# Patient Record
Sex: Female | Born: 1955 | Race: White | Hispanic: No | Marital: Married | Smoking: Never smoker
Health system: Southern US, Community
[De-identification: ages and names within clinical notes are randomized; demographics above are authoritative.]

## PROBLEM LIST (undated history)

## (undated) DIAGNOSIS — N809 Endometriosis, unspecified: Secondary | ICD-10-CM

## (undated) DIAGNOSIS — N952 Postmenopausal atrophic vaginitis: Secondary | ICD-10-CM

## (undated) DIAGNOSIS — Z803 Family history of malignant neoplasm of breast: Secondary | ICD-10-CM

## (undated) HISTORY — DX: Postmenopausal atrophic vaginitis: N95.2

## (undated) HISTORY — DX: Endometriosis, unspecified: N80.9

## (undated) HISTORY — PX: FOOT SURGERY: SHX648

## (undated) HISTORY — DX: Family history of malignant neoplasm of breast: Z80.3

---

## 1983-09-17 HISTORY — PX: LAPAROSCOPIC HYSTERECTOMY: SHX1926

## 1993-09-16 HISTORY — PX: OOPHORECTOMY: SHX86

## 2000-02-20 HISTORY — PX: BREAST BIOPSY: SHX20

## 2003-11-20 ENCOUNTER — Emergency Department (HOSPITAL_COMMUNITY): Admission: EM | Admit: 2003-11-20 | Discharge: 2003-11-20 | Payer: Self-pay | Admitting: Emergency Medicine

## 2004-08-14 ENCOUNTER — Ambulatory Visit: Payer: Self-pay

## 2004-09-16 HISTORY — PX: PLACEMENT OF BREAST IMPLANTS: SHX6334

## 2007-02-22 ENCOUNTER — Other Ambulatory Visit: Payer: Self-pay

## 2007-02-22 ENCOUNTER — Emergency Department: Payer: Self-pay | Admitting: Unknown Physician Specialty

## 2007-02-23 ENCOUNTER — Ambulatory Visit: Payer: Self-pay | Admitting: Unknown Physician Specialty

## 2008-09-16 HISTORY — PX: COLONOSCOPY: SHX174

## 2008-12-06 ENCOUNTER — Ambulatory Visit: Payer: Self-pay | Admitting: Unknown Physician Specialty

## 2011-09-17 HISTORY — PX: COLONOSCOPY: SHX174

## 2013-11-09 ENCOUNTER — Ambulatory Visit: Payer: Self-pay | Admitting: Podiatry

## 2013-11-11 ENCOUNTER — Encounter: Payer: Self-pay | Admitting: Podiatry

## 2013-11-12 ENCOUNTER — Ambulatory Visit (INDEPENDENT_AMBULATORY_CARE_PROVIDER_SITE_OTHER): Payer: BC Managed Care – PPO

## 2013-11-12 ENCOUNTER — Ambulatory Visit (INDEPENDENT_AMBULATORY_CARE_PROVIDER_SITE_OTHER): Payer: BC Managed Care – PPO | Admitting: Podiatry

## 2013-11-12 ENCOUNTER — Encounter: Payer: Self-pay | Admitting: Podiatry

## 2013-11-12 VITALS — BP 118/64 | HR 74 | Resp 16 | Ht 64.0 in | Wt 140.0 lb

## 2013-11-12 DIAGNOSIS — L600 Ingrowing nail: Secondary | ICD-10-CM

## 2013-11-12 DIAGNOSIS — M2040 Other hammer toe(s) (acquired), unspecified foot: Secondary | ICD-10-CM

## 2013-11-12 DIAGNOSIS — M79673 Pain in unspecified foot: Secondary | ICD-10-CM

## 2013-11-12 DIAGNOSIS — M79609 Pain in unspecified limb: Secondary | ICD-10-CM

## 2013-11-12 NOTE — Patient Instructions (Addendum)

## 2013-11-14 NOTE — Progress Notes (Signed)
Subjective:     Patient ID: Rachel Dorsey, female   DOB: [birth year], 58 y.o.   MRN: [id number]  HPI patient presents stating that the left hallux nail has become ingrown and sore and the third toe right is mildly lifted after arthroplasty and is now her for that the nail is somewhat thickened and reactive   Review of Systems     Objective:   Physical Exam Neurovascular status intact with no health history changes noted a patient well oriented x3. Medial border left hallux is incurvated with slight yellow discoloration of the hallux nail and also third toe right is mildly elevated nonweightbearing but is not elevated weightbearing and does have some discoloration and thickness of the nail bed    Assessment:     Ingrown toenail left hallux and mild mycotic nail right third nail secondary to probable increased pressure    Plan:     Reviewed x-rays of right foot and discussed consideration for tenotomy which patient will think about. Recommended removal of nail corner left and explained procedure and risk. Patient wants procedure and today I infiltrated 60 mg Xylocaine Marcaine mixture remove the medial border exposed matrix and applied 3 applications of phenol 30 seconds followed by alcohol lavaged and sterile dressing. Gave instructions on soaks and reappoint

## 2013-11-25 ENCOUNTER — Ambulatory Visit: Payer: Self-pay | Admitting: Internal Medicine

## 2014-10-25 DIAGNOSIS — K7689 Other specified diseases of liver: Secondary | ICD-10-CM | POA: Insufficient documentation

## 2014-11-23 ENCOUNTER — Ambulatory Visit: Payer: Self-pay | Admitting: Internal Medicine

## 2014-12-10 ENCOUNTER — Ambulatory Visit: Payer: Self-pay | Admitting: Internal Medicine

## 2014-12-10 LAB — CREATININE, SERUM
CREATININE: 0.77 mg/dL
EGFR (Non-African Amer.): >60

## 2015-02-16 ENCOUNTER — Encounter: Payer: Self-pay | Admitting: *Deleted

## 2015-02-28 ENCOUNTER — Ambulatory Visit (INDEPENDENT_AMBULATORY_CARE_PROVIDER_SITE_OTHER): Payer: Managed Care, Other (non HMO) | Admitting: General Surgery

## 2015-02-28 ENCOUNTER — Other Ambulatory Visit: Payer: Managed Care, Other (non HMO)

## 2015-02-28 ENCOUNTER — Encounter: Payer: Self-pay | Admitting: General Surgery

## 2015-02-28 VITALS — BP 128/82 | HR 82 | Resp 12 | Ht 64.0 in | Wt 146.0 lb

## 2015-02-28 DIAGNOSIS — R928 Other abnormal and inconclusive findings on diagnostic imaging of breast: Secondary | ICD-10-CM

## 2015-02-28 DIAGNOSIS — N6310 Unspecified lump in the right breast, unspecified quadrant: Secondary | ICD-10-CM

## 2015-02-28 NOTE — Progress Notes (Signed)
Patient ID: Rachel Dorsey, female   DOB: [birth year], 59 y.o.   MRN: [id number]  Chief Complaint  Patient presents with  . Other    eval mammogram    HPI Rachel SCOGIN is a 59 y.o. female.  who presents for a breast evaluation. The most recent mammogram was done on 02-02-15. She also had a right breast mammogram 02-08-15 and ultrasound on 02-09-15.  Patient does perform regular self breast checks and gets regular mammograms done.  She states the right breast has been a little tender on the lower part of the breast for 3-4 months. She states she can not feel a lump. Denies any breast injury or trauma. With her recently completed radiologic studies, the patient elected to discontinue her as to general placement therapy. To date, she has not experienced much in the way vasomotor symptoms. She stopped taking her hormone tablet 3 weeks ago. She had breast implants she went from a B to D.  The patient was last seen here in 2001 for a mass in the upper-outer quadrant of the left breast. Multiple cysts were identified at that time.   HPI  History reviewed. No pertinent past medical history.  Past Surgical History  Procedure Laterality Date  . Foot surgery Right   . Laparoscopic hysterectomy  1985    age 35  . Placement of breast implants  [address] [place]. Mentor saline implants.   . Oophorectomy  1995  . Breast biopsy Right 02-20-00    cyst  . Colonoscopy  2013    Dr Vira Agar    Family History  Problem Relation Age of Onset  . Aortic aneurysm Father 15  . Breast cancer Maternal Grandmother 67    Social History History  Substance Use Topics  . Smoking status: Never Smoker   . Smokeless tobacco: Never Used  . Alcohol Use: 0.0 oz/week    0 Standard drinks or equivalent per week     Comment: occasionally wine 1/week    Allergies  Allergen Reactions  . Erythromycin Diarrhea    Colitis    Current Outpatient Prescriptions  Medication Sig Dispense Refill  .  Cholecalciferol (VITAMIN D-3) 1000 UNITS CAPS Take by mouth.    . vitamin B-12 (CYANOCOBALAMIN) 100 MCG tablet Take 100 mcg by mouth daily.     No current facility-administered medications for this visit.    Review of Systems Review of Systems  Constitutional: Negative.   Respiratory: Negative.   Cardiovascular: Negative.     Blood pressure 128/82, pulse 82, resp. rate 12, height 5\' 4"  (1.626 m), weight 146 lb (66.225 kg).  Physical Exam Physical Exam  Constitutional: She is oriented to person, place, and time. She appears well-developed and well-nourished.  Neck: Neck supple.  Cardiovascular: Normal rate, regular rhythm and normal heart sounds.   Pulmonary/Chest: Effort normal and breath sounds normal. Right breast exhibits no inverted nipple, no mass, no nipple discharge, no skin change and no tenderness. Left breast exhibits no inverted nipple, no mass, no nipple discharge, no skin change and no tenderness.  Lymphadenopathy:    She has no cervical adenopathy.    She has no axillary adenopathy.  Neurological: She is alert and oriented to person, place, and time.  Skin: Skin is warm and dry.    Data Reviewed PCP notes of 01/10/2015 were reviewed. Note patient made to prior attempts to discontinue estradiol therapy were hampered by severe vasomotor symptoms. Normal breast exam recorded.  Bilateral screening mammograms  dated 02/08/2015 completed at [facility] in [place] were reviewed as well as an associated ultrasound. This was a 3-D exam. An oval mass was identified in the lower outer aspect of the right breast. BI-RADS-0. Ultrasound examination showed a 7 mm oval complex cyst which a 6 month follow-up was recommended. BI-RADS-3.  Breast implant data sheet reviewed.    Assessment    Mild mastalgia, likely unrelated to recently identified complex cyst involving the right breast.  Significant anxiety with long-standing estrogen use and recent mammographic findings.     Plan    The opportunity for FNA sampling of the area was reviewed and acceptable patient. The small but real risk of implant injury was discussed.  Ultrasound examination of the right breast in the 6:00 position, 7 cm from the nipple showed a 0.3 x 0.4 x 0.46 cm complex lesion corresponding to the slightly larger lesion noted last month on her original imaging studies and 3 weeks status post cessation of hormone therapy. Using 1 mL of 1% plain Xylocaine the area was aspirated with complete resolution. The pectoralis muscle was well visualized during the procedure. Of note, multiple other small cystic areas were noted throughout the retroareolar area of the breast. Slides 4 were prepared for cytology.  The recommendation for a 6 month follow-up ultrasound is appropriate.    Will notify her of the FNA results. Follow up in November with office ultrasound.  PCP:  Emily Filbert F Ref: Fraser Din  Robert Bellow 03/01/2015, 6:40 PM

## 2015-02-28 NOTE — Patient Instructions (Addendum)
The patient is aware to call back for any questions or concerns. Continue self breast exams. Call office for any new breast issues or concerns. 

## 2015-03-01 ENCOUNTER — Encounter: Payer: Self-pay | Admitting: General Surgery

## 2015-03-01 ENCOUNTER — Telehealth: Payer: Self-pay | Admitting: General Surgery

## 2015-03-01 DIAGNOSIS — R928 Other abnormal and inconclusive findings on diagnostic imaging of breast: Secondary | ICD-10-CM | POA: Insufficient documentation

## 2015-03-01 NOTE — Telephone Encounter (Signed)
Notified cytology results were benign, consistent with fibrocystic changes. (Likely perpetuated by long term estradiol therapy. Follow up as previously scheduled.

## 2015-07-25 ENCOUNTER — Encounter: Payer: Self-pay | Admitting: General Surgery

## 2015-07-31 ENCOUNTER — Encounter: Payer: Self-pay | Admitting: General Surgery

## 2015-07-31 ENCOUNTER — Ambulatory Visit: Payer: Managed Care, Other (non HMO)

## 2015-07-31 ENCOUNTER — Ambulatory Visit (INDEPENDENT_AMBULATORY_CARE_PROVIDER_SITE_OTHER): Payer: Managed Care, Other (non HMO) | Admitting: General Surgery

## 2015-07-31 VITALS — BP 122/78 | HR 72 | Resp 12 | Ht 64.0 in | Wt 152.0 lb

## 2015-07-31 DIAGNOSIS — N6009 Solitary cyst of unspecified breast: Secondary | ICD-10-CM | POA: Insufficient documentation

## 2015-07-31 DIAGNOSIS — N6001 Solitary cyst of right breast: Secondary | ICD-10-CM

## 2015-07-31 DIAGNOSIS — N644 Mastodynia: Secondary | ICD-10-CM

## 2015-07-31 DIAGNOSIS — N6310 Unspecified lump in the right breast, unspecified quadrant: Secondary | ICD-10-CM

## 2015-07-31 NOTE — Progress Notes (Signed)
Patient ID: Rachel Dorsey, female   DOB: 1956/08/04, 59 y.o.   MRN: [id number]  Chief Complaint  Patient presents with  . Follow-up    right breast ultrasound    HPI Rachel Dorsey is a 59 y.o. female here today for her five month follow up for a right breast ultrasound. She states the tenderness is still there, comes and goes.  HPI  No past medical history on file.  Past Surgical History  Procedure Laterality Date  . Foot surgery Right   . Laparoscopic hysterectomy  1985    age 12  . Placement of breast implants  [address], [place]. Mentor saline implants.   . Oophorectomy  1995  . Breast biopsy Right 02-20-00    cyst  . Colonoscopy  2013    Dr Vira Agar    Family History  Problem Relation Age of Onset  . Aortic aneurysm Father 62  . Breast cancer Maternal Grandmother 60    Social History Social History  Substance Use Topics  . Smoking status: Never Smoker   . Smokeless tobacco: Never Used  . Alcohol Use: 0.0 oz/week    0 Standard drinks or equivalent per week     Comment: occasionally wine 1/week    Allergies  Allergen Reactions  . Erythromycin Diarrhea    Colitis    Current Outpatient Prescriptions  Medication Sig Dispense Refill  . Cholecalciferol (VITAMIN D-3) 1000 UNITS CAPS Take by mouth.    . vitamin B-12 (CYANOCOBALAMIN) 100 MCG tablet Take 100 mcg by mouth daily.     No current facility-administered medications for this visit.    Review of Systems Review of Systems  Constitutional: Negative.   Respiratory: Negative.   Cardiovascular: Negative.     Blood pressure 122/78, pulse 72, resp. rate 12, height 5\' 4"  (1.626 m), weight 152 lb (68.947 kg).  Physical Exam Physical Exam  Constitutional: She is oriented to person, place, and time. She appears well-developed and well-nourished.  Eyes: Conjunctivae are normal. No scleral icterus.  Neck: Neck supple.  Cardiovascular: Normal rate, regular rhythm and normal heart sounds.    Pulmonary/Chest: Effort normal and breath sounds normal. Right breast exhibits no inverted nipple, no mass, no nipple discharge, no skin change and no tenderness. Left breast exhibits no inverted nipple, no mass, no nipple discharge, no skin change and no tenderness.    Lymphadenopathy:    She has no cervical adenopathy.  Neurological: She is alert and oriented to person, place, and time.  Skin: Skin is warm and dry.    Data Reviewed Cytology from June 2016 evaluation was benign. NA RIGHT BREAST: ABUNDANT BANAL APPEARING APOCRINE CELLS. SEE COMMENT. COMMENT: THE FINDINGS MAY REPRESENT FIBROCYSTIC CHANGES. NONETHELESS, CYTOLOGIC MALIGNANT FEATURES ARE NOT IDENTIFIED.  2001 right breast cyst aspiration showed foam cells.  Ultrasound examination of the right breast was repeated to reassess the area of previous mammographic/ultrasound abnormality. There is a small 0.16 x 0.21 x 0.2 to recurrent simple cyst in this area approximately 4 mm above the underlying implant. No internal flow. Faint acoustic enhancement. This is located 7 cm from the nipple.  Examination of the retroareolar area again shows prominent ductal tissue measuring up to 0.21 cm in diameter. No intraductal lesions identified. BI-RADS-2.   Assessment    Benign breast exam.    Plan    Observation alone is warranted. The patient had been instructed to have a follow-up examination in December at the mammography center. This is not  necessary based on her previous previously completed FNA and repeat ultrasound today.  She should continue annual screening mammograms in summer 2017 with her PCP.  She was asked to call should she appreciate any change in her breast discomfort, more persistent or localized discomfort or any palpable abnormality.    Patient to return as needed.  PCP:  Fanny Skates 07/31/2015, 12:28 PM

## 2015-07-31 NOTE — Patient Instructions (Signed)
Patient to return as needed. Continue self breast exams. Call office for any new breast issues or concerns.'  

## 2016-05-31 ENCOUNTER — Encounter: Payer: Self-pay | Admitting: *Deleted

## 2016-05-31 ENCOUNTER — Encounter: Payer: Self-pay | Admitting: Podiatry

## 2016-05-31 ENCOUNTER — Ambulatory Visit (INDEPENDENT_AMBULATORY_CARE_PROVIDER_SITE_OTHER): Payer: 59

## 2016-05-31 ENCOUNTER — Ambulatory Visit (INDEPENDENT_AMBULATORY_CARE_PROVIDER_SITE_OTHER): Payer: 59 | Admitting: Podiatry

## 2016-05-31 VITALS — BP 106/68 | HR 75 | Resp 16

## 2016-05-31 DIAGNOSIS — Z780 Asymptomatic menopausal state: Secondary | ICD-10-CM | POA: Insufficient documentation

## 2016-05-31 DIAGNOSIS — M79673 Pain in unspecified foot: Secondary | ICD-10-CM | POA: Diagnosis not present

## 2016-05-31 DIAGNOSIS — M7750 Other enthesopathy of unspecified foot: Secondary | ICD-10-CM

## 2016-05-31 DIAGNOSIS — M7752 Other enthesopathy of left foot: Secondary | ICD-10-CM | POA: Diagnosis not present

## 2016-05-31 DIAGNOSIS — M778 Other enthesopathies, not elsewhere classified: Secondary | ICD-10-CM

## 2016-05-31 DIAGNOSIS — M779 Enthesopathy, unspecified: Secondary | ICD-10-CM

## 2016-05-31 DIAGNOSIS — I341 Nonrheumatic mitral (valve) prolapse: Secondary | ICD-10-CM | POA: Insufficient documentation

## 2016-05-31 MED ORDER — METHYLPREDNISOLONE 4 MG PO TBPK: ORAL_TABLET | ORAL | 0 refills | Status: DC

## 2016-05-31 NOTE — Progress Notes (Signed)
   Subjective:    Patient ID: Rachel Dorsey, female    DOB: [birth year], 61 y.o.   MRN: [id number]  HPI    Review of Systems  All other systems reviewed and are negative.      Objective:   Physical Exam        Assessment & Plan:

## 2016-06-04 MED ORDER — BETAMETHASONE SOD PHOS & ACET 6 (3-3) MG/ML IJ SUSP: 12.0000 mg | Freq: Once | INTRAMUSCULAR | Status: AC

## 2016-06-04 NOTE — Progress Notes (Signed)
Patient ID: Rachel Dorsey, female   DOB: [birth year], 60 y.o.   MRN: [id number] Subjective: Patient presents today for bilateral forefoot pain. Patient is more symptomatic on the left foot. Patient states that the pain has been ongoing for some time now. Patient presents for further treatment and evaluation   Objective: Physical Exam General: The patient is alert and oriented x3 in no acute distress.  Dermatology: Skin is warm, dry and supple bilateral lower extremities. Negative for open lesions or macerations.  Vascular: Palpable pedal pulses bilaterally. No edema or erythema noted. Capillary refill within normal limits.  Neurological: Epicritic and protective threshold grossly intact bilaterally.   Musculoskeletal Exam:  Severe pain on palpation to the first and second MPJs of the left foot. Also pain on palpation to the first and second MPJs of the right foot. Range of motion within normal limits to all pedal and ankle joints bilateral. Muscle strength 5/5 in all groups bilateral.   Radiographic Exam:  Normal osseous mineralization. Joint spaces preserved. No fracture/dislocation/boney destruction.     Assessment: #1 pain in bilateral feet #2 capsulitis first and second MPJ left foot #3 capsulitis first and second MPJ right foot  Problem List Items Addressed This Visit    None    Visit Diagnoses    Foot pain, unspecified laterality    -  Primary   Relevant Orders   DG Foot Complete Left   DG Foot Complete Right        Plan of Care:  #1 Patient was evaluated.X-rays were taken and reviewed with the patient #2 Injection of 0.5 mL Celestone Soluspan injected in the first MPJ capsule of the left foot. #3 injection of 0.5 mL Celestone Soluspan injected into the second MPJ capsule the left foot. #4 Medrol Dosepak prescribed for the patient  #5 metatarsal pads dispensed to the patient #6 patient is to return to clinic in 4 weeks     Dr. Edrick Kins, [place]

## 2016-07-09 ENCOUNTER — Ambulatory Visit: Payer: 59 | Admitting: Podiatry

## 2016-09-17 ENCOUNTER — Encounter: Payer: Self-pay | Admitting: *Deleted

## 2016-09-23 ENCOUNTER — Ambulatory Visit: Payer: 59 | Admitting: General Surgery

## 2016-10-01 ENCOUNTER — Ambulatory Visit (INDEPENDENT_AMBULATORY_CARE_PROVIDER_SITE_OTHER): Payer: 59 | Admitting: General Surgery

## 2016-10-01 ENCOUNTER — Encounter: Payer: Self-pay | Admitting: General Surgery

## 2016-10-01 VITALS — BP 122/72 | HR 80 | Resp 12 | Ht 64.0 in | Wt 155.0 lb

## 2016-10-01 DIAGNOSIS — N644 Mastodynia: Secondary | ICD-10-CM | POA: Diagnosis not present

## 2016-10-01 NOTE — Patient Instructions (Addendum)
The patient is aware to call back for any questions or concerns. Return as needed.  

## 2016-10-01 NOTE — Progress Notes (Signed)
Patient ID: Rachel Dorsey, female   DOB: [birth year], 61 y.o.   MRN: [id number]  Chief Complaint  Patient presents with  . Breast Problem    right breast cyst    HPI Rachel Dorsey is a 61 y.o. female.  Here today for evaluation of a possible right breast cyst. Patient states the whole month of December the areas wastender, now in the last week the area has improved.   HPI  No past medical history on file.  Past Surgical History:  Procedure Laterality Date  . BREAST BIOPSY Right 02-20-00   cyst  . COLONOSCOPY  2013   Dr Vira Agar  . FOOT SURGERY Right   . LAPAROSCOPIC HYSTERECTOMY  1985   age 53  . OOPHORECTOMY  1995  . PLACEMENT OF BREAST IMPLANTS  2006   [place], [place]. Mentor saline implants.     Family History  Problem Relation Age of Onset  . Aortic aneurysm Father 45  . Breast cancer Maternal Grandmother 60    Social History Social History  Substance Use Topics  . Smoking status: Never Smoker  . Smokeless tobacco: Never Used  . Alcohol use 0.0 oz/week     Comment: occasionally wine 1/week    Allergies  Allergen Reactions  . Erythromycin Diarrhea    Colitis    Current Outpatient Prescriptions  Medication Sig Dispense Refill  . Cholecalciferol (VITAMIN D-3) 1000 UNITS CAPS Take by mouth.    . vitamin B-12 (CYANOCOBALAMIN) 100 MCG tablet Take 100 mcg by mouth daily.     Current Facility-Administered Medications  Medication Dose Route Frequency Provider Last Rate Last Dose  . betamethasone acetate-betamethasone sodium phosphate (CELESTONE) injection 12 mg  12 mg Intramuscular Once Edrick Kins, DPM        Review of Systems Review of Systems  Constitutional: Negative.   Respiratory: Negative.   Cardiovascular: Negative.     Blood pressure 122/72, pulse 80, resp. rate 12, height 5\' 4"  (1.626 m), weight 155 lb (70.3 kg).  Physical Exam Physical Exam  Constitutional: She is oriented to person, place, and time. She appears well-developed and  well-nourished.  Eyes: Conjunctivae are normal. No scleral icterus.  Neck: Neck supple.  Cardiovascular: Normal rate, regular rhythm and normal heart sounds.   Pulmonary/Chest: Effort normal and breath sounds normal. Right breast exhibits no inverted nipple, no mass, no nipple discharge, no skin change and no tenderness. Left breast exhibits no inverted nipple, no mass, no nipple discharge, no skin change and no tenderness.  Lymphadenopathy:    She has no cervical adenopathy.    She has no axillary adenopathy.  Neurological: She is alert and oriented to person, place, and time.  Skin: Skin is warm and dry.  Psychiatric: Her behavior is normal.    Data Reviewed May 2017 mammograms completed at [facility] not available for review.  Assessment    Benign breast exam.    Plan    Annual screening mammograms and clinical self-exam recommended.    Return as needed.  This information has been scribed by Gaspar Cola CMA. Robert Bellow 10/02/2016, 1:53 PM

## 2016-11-18 DIAGNOSIS — Z719 Counseling, unspecified: Secondary | ICD-10-CM | POA: Diagnosis not present

## 2016-11-21 DIAGNOSIS — Z0000 Encounter for general adult medical examination without abnormal findings: Secondary | ICD-10-CM | POA: Diagnosis not present

## 2016-11-27 DIAGNOSIS — Z719 Counseling, unspecified: Secondary | ICD-10-CM | POA: Diagnosis not present

## 2016-11-28 DIAGNOSIS — Z0000 Encounter for general adult medical examination without abnormal findings: Secondary | ICD-10-CM | POA: Diagnosis not present

## 2016-12-02 DIAGNOSIS — Z719 Counseling, unspecified: Secondary | ICD-10-CM | POA: Diagnosis not present

## 2016-12-16 DIAGNOSIS — Z719 Counseling, unspecified: Secondary | ICD-10-CM | POA: Diagnosis not present

## 2016-12-26 ENCOUNTER — Other Ambulatory Visit: Payer: Self-pay | Admitting: Internal Medicine

## 2016-12-26 DIAGNOSIS — K5909 Other constipation: Secondary | ICD-10-CM | POA: Diagnosis not present

## 2016-12-26 DIAGNOSIS — R1011 Right upper quadrant pain: Secondary | ICD-10-CM | POA: Diagnosis not present

## 2016-12-30 DIAGNOSIS — Z719 Counseling, unspecified: Secondary | ICD-10-CM | POA: Diagnosis not present

## 2017-01-03 ENCOUNTER — Other Ambulatory Visit: Payer: Self-pay

## 2017-01-03 ENCOUNTER — Encounter
Admission: RE | Admit: 2017-01-03 | Discharge: 2017-01-03 | Disposition: A | Discharge status: Home / Self Care | Payer: 59 | Source: Ambulatory Visit | Attending: Internal Medicine | Admitting: Internal Medicine

## 2017-01-03 DIAGNOSIS — K7689 Other specified diseases of liver: Secondary | ICD-10-CM | POA: Diagnosis not present

## 2017-01-03 DIAGNOSIS — R1011 Right upper quadrant pain: Secondary | ICD-10-CM | POA: Diagnosis present

## 2017-01-06 DIAGNOSIS — Z719 Counseling, unspecified: Secondary | ICD-10-CM | POA: Diagnosis not present

## 2017-01-13 DIAGNOSIS — Z719 Counseling, unspecified: Secondary | ICD-10-CM | POA: Diagnosis not present

## 2017-01-15 ENCOUNTER — Encounter
Admission: RE | Admit: 2017-01-15 | Discharge: 2017-01-15 | Disposition: A | Discharge status: Home / Self Care | Payer: 59 | Source: Ambulatory Visit | Attending: Internal Medicine | Admitting: Internal Medicine

## 2017-01-15 DIAGNOSIS — R1011 Right upper quadrant pain: Secondary | ICD-10-CM | POA: Diagnosis not present

## 2017-01-15 MED ORDER — TECHNETIUM TC 99M MEBROFENIN IV KIT
5.0000 | PACK | Freq: Once | INTRAVENOUS | Status: AC | PRN
  Administered 2017-01-15: 5.375 via INTRAVENOUS

## 2017-02-06 ENCOUNTER — Encounter: Payer: Self-pay | Admitting: Family Medicine

## 2017-02-06 ENCOUNTER — Encounter: Payer: Self-pay | Admitting: Obstetrics and Gynecology

## 2017-02-06 DIAGNOSIS — Z1231 Encounter for screening mammogram for malignant neoplasm of breast: Secondary | ICD-10-CM | POA: Diagnosis not present

## 2017-02-13 ENCOUNTER — Encounter: Payer: Self-pay | Admitting: Obstetrics and Gynecology

## 2017-02-17 ENCOUNTER — Telehealth: Payer: Self-pay | Admitting: Obstetrics and Gynecology

## 2017-02-17 NOTE — Telephone Encounter (Signed)
Pt aware of normal bone density on DEXA scan at [facility] 5/18.

## 2017-02-25 DIAGNOSIS — R1013 Epigastric pain: Secondary | ICD-10-CM | POA: Diagnosis not present

## 2017-03-04 ENCOUNTER — Other Ambulatory Visit: Payer: Self-pay | Admitting: Internal Medicine

## 2017-03-04 DIAGNOSIS — R1084 Generalized abdominal pain: Secondary | ICD-10-CM | POA: Diagnosis not present

## 2017-03-06 ENCOUNTER — Ambulatory Visit
Admission: RE | Admit: 2017-03-06 | Discharge: 2017-03-06 | Disposition: A | Discharge status: Home / Self Care | Payer: Commercial Managed Care - HMO | Source: Ambulatory Visit | Attending: Internal Medicine | Admitting: Internal Medicine

## 2017-03-06 DIAGNOSIS — R1084 Generalized abdominal pain: Secondary | ICD-10-CM | POA: Diagnosis present

## 2017-03-06 DIAGNOSIS — R109 Unspecified abdominal pain: Secondary | ICD-10-CM | POA: Diagnosis not present

## 2017-03-06 DIAGNOSIS — K7689 Other specified diseases of liver: Secondary | ICD-10-CM | POA: Insufficient documentation

## 2017-03-06 DIAGNOSIS — Z90710 Acquired absence of both cervix and uterus: Secondary | ICD-10-CM | POA: Insufficient documentation

## 2017-03-06 LAB — POCT I-STAT CREATININE: CREATININE: 0.8 mg/dL (ref 0.44–1.00)

## 2017-03-06 MED ORDER — IOPAMIDOL (ISOVUE-300) INJECTION 61%
100.0000 mL | Freq: Once | INTRAVENOUS | Status: AC | PRN
  Administered 2017-03-06: 100 mL via INTRAVENOUS

## 2017-03-13 DIAGNOSIS — R1084 Generalized abdominal pain: Secondary | ICD-10-CM | POA: Diagnosis not present

## 2017-05-15 DIAGNOSIS — L814 Other melanin hyperpigmentation: Secondary | ICD-10-CM | POA: Diagnosis not present

## 2017-06-19 DIAGNOSIS — Z23 Encounter for immunization: Secondary | ICD-10-CM | POA: Diagnosis not present

## 2017-06-23 ENCOUNTER — Ambulatory Visit (INDEPENDENT_AMBULATORY_CARE_PROVIDER_SITE_OTHER): Payer: 59 | Admitting: Podiatry

## 2017-06-23 ENCOUNTER — Ambulatory Visit: Payer: 59

## 2017-06-23 ENCOUNTER — Ambulatory Visit (INDEPENDENT_AMBULATORY_CARE_PROVIDER_SITE_OTHER): Payer: 59

## 2017-06-23 DIAGNOSIS — M722 Plantar fascial fibromatosis: Secondary | ICD-10-CM | POA: Diagnosis not present

## 2017-06-23 DIAGNOSIS — S92911A Unspecified fracture of right toe(s), initial encounter for closed fracture: Secondary | ICD-10-CM

## 2017-06-23 DIAGNOSIS — S90121A Contusion of right lesser toe(s) without damage to nail, initial encounter: Secondary | ICD-10-CM

## 2017-06-23 MED ORDER — MELOXICAM 15 MG PO TABS: 15.0000 mg | ORAL_TABLET | Freq: Every day | ORAL | 3 refills | Status: DC

## 2017-06-23 NOTE — Progress Notes (Signed)
This patient presents to the office stating she injured her third toe right foot and may have broken her toe.  She says she had previous surgery in this toe and it sits up.  She says Saturday her pain was severe but the pain has lessened through the weekend.  She says she has developed purplish discoloration through the weekend.  She also believes she has plantar fascia left heel  She says she has pain upon rising in the morning and standing from a sitting position.  She says her pain level is 4.  She says ibuprofen has helped reduce her pain.  This pain has been present for 2-3 months and has not resolved.  She presents for evaluation and treatment  General Appearance  Alert, conversant and in no acute stress.  Vascular  Dorsalis pedis and posterior pulses are palpable  bilaterally.  Capillary return is within normal limits  Bilaterally. Temperature is within normal limits  Bilaterally  Neurologic  Senn-Weinstein monofilament wire test within normal limits  bilaterally. Muscle power  Within normal limits bilaterally.  Nails Normal nails with no evidence of infection or fungus.  Orthopedic  No limitations of motion of motion feet bilaterally.  No crepitus or effusions noted.  No bony pathology or digital deformities noted. Third toe is in fixed dorsiflexed position with ecchymosis noted at third  MPJ.  Palpable pain at the insertion plantar fascia left foot.    Skin  normotropic skin with no porokeratosis noted bilaterally.  No signs of infections or ulcers noted.  Incisions on 3,4 right toes.    Contusion 3rd toe right foot  Plantar fascitis left heel.  ROV  Xrays reveal no bony pathology but absence of bone noted 3,4 toes right foot.  Minimal calcification at the insertion plantar fascia left foot.  Buddy taping of 3,4 toes right was performed.   Prescribe  Mobic  To be taken by mouth.  RTC 3 weeks.   Gardiner Barefoot DPM

## 2017-06-23 NOTE — Addendum Note (Signed)
Addended by: Graceann Congress D on: 06/23/2017 02:10 PM   Modules accepted: Orders

## 2017-07-02 DIAGNOSIS — M722 Plantar fascial fibromatosis: Secondary | ICD-10-CM | POA: Diagnosis not present

## 2017-07-03 ENCOUNTER — Ambulatory Visit: Payer: 59 | Admitting: Podiatry

## 2017-07-07 DIAGNOSIS — H11441 Conjunctival cysts, right eye: Secondary | ICD-10-CM | POA: Diagnosis not present

## 2017-07-14 ENCOUNTER — Ambulatory Visit: Payer: 59 | Admitting: Podiatry

## 2017-10-07 DIAGNOSIS — H0014 Chalazion left upper eyelid: Secondary | ICD-10-CM | POA: Diagnosis not present

## 2017-10-09 DIAGNOSIS — Z23 Encounter for immunization: Secondary | ICD-10-CM | POA: Diagnosis not present

## 2017-10-25 DIAGNOSIS — J019 Acute sinusitis, unspecified: Secondary | ICD-10-CM | POA: Diagnosis not present

## 2017-12-03 DIAGNOSIS — H0014 Chalazion left upper eyelid: Secondary | ICD-10-CM | POA: Diagnosis not present

## 2017-12-17 DIAGNOSIS — M79672 Pain in left foot: Secondary | ICD-10-CM | POA: Diagnosis not present

## 2017-12-17 DIAGNOSIS — M79671 Pain in right foot: Secondary | ICD-10-CM | POA: Diagnosis not present

## 2017-12-17 DIAGNOSIS — M722 Plantar fascial fibromatosis: Secondary | ICD-10-CM | POA: Diagnosis not present

## 2017-12-24 DIAGNOSIS — H43811 Vitreous degeneration, right eye: Secondary | ICD-10-CM | POA: Diagnosis not present

## 2018-01-23 DIAGNOSIS — Z0000 Encounter for general adult medical examination without abnormal findings: Secondary | ICD-10-CM | POA: Diagnosis not present

## 2018-01-30 DIAGNOSIS — Z0000 Encounter for general adult medical examination without abnormal findings: Secondary | ICD-10-CM | POA: Diagnosis not present

## 2018-02-14 DIAGNOSIS — Z1231 Encounter for screening mammogram for malignant neoplasm of breast: Secondary | ICD-10-CM | POA: Diagnosis not present

## 2018-03-16 DIAGNOSIS — H0014 Chalazion left upper eyelid: Secondary | ICD-10-CM | POA: Diagnosis not present

## 2018-03-23 DIAGNOSIS — H00014 Hordeolum externum left upper eyelid: Secondary | ICD-10-CM | POA: Diagnosis not present

## 2018-03-23 DIAGNOSIS — H0019 Chalazion unspecified eye, unspecified eyelid: Secondary | ICD-10-CM | POA: Diagnosis not present

## 2018-03-23 DIAGNOSIS — R220 Localized swelling, mass and lump, head: Secondary | ICD-10-CM | POA: Diagnosis not present

## 2018-05-05 DIAGNOSIS — H16223 Keratoconjunctivitis sicca, not specified as Sjogren's, bilateral: Secondary | ICD-10-CM | POA: Diagnosis not present

## 2018-07-07 DIAGNOSIS — Z23 Encounter for immunization: Secondary | ICD-10-CM | POA: Diagnosis not present

## 2018-09-09 IMAGING — CT CT ABD-PELV W/ CM
1 of 3 series · 14 of 32 positions shown, 19 images · IV contrast (APPLIED)
Comparison: 01/03/2017

CLINICAL DATA: Generalized abdominal pain

EXAM:
CT ABDOMEN AND PELVIS WITH CONTRAST
TECHNIQUE: Multidetector CT imaging of the abdomen and pelvis was performed
using the standard protocol following bolus administration of
intravenous contrast.
CONTRAST:  100mL ZQLO81-JRR IOPAMIDOL (ZQLO81-JRR) INJECTION 61%

[Series 2: axial st · axial · 0.62mm/px · z∈[-1090,-715]mm · 14 of 85 slices shown, 19 images]
[im 5/85  soft-tissue]
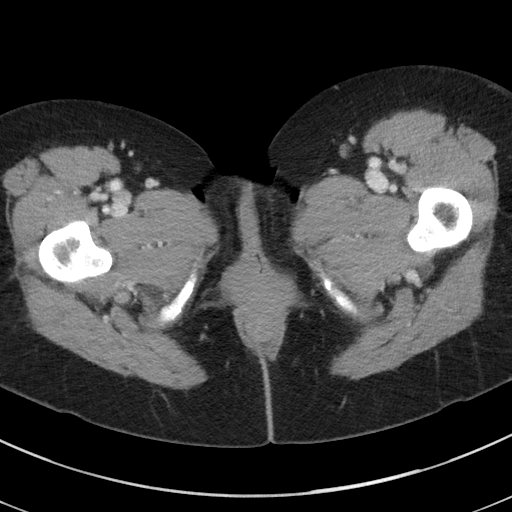
[im 5/85  bone]
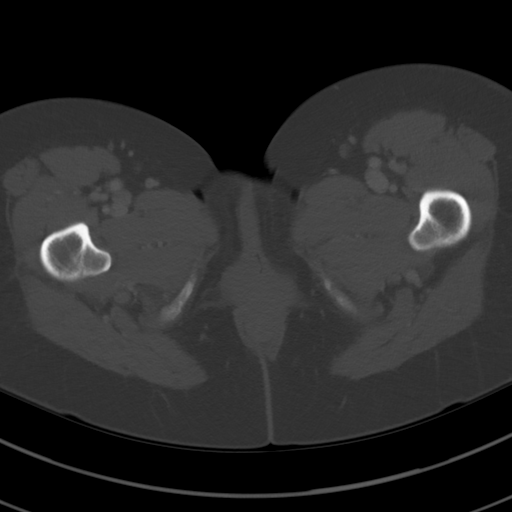
[im 13/85  soft-tissue]
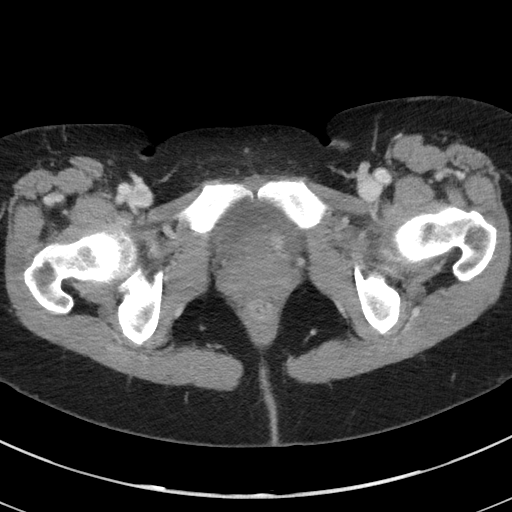
[im 17/85  soft-tissue]
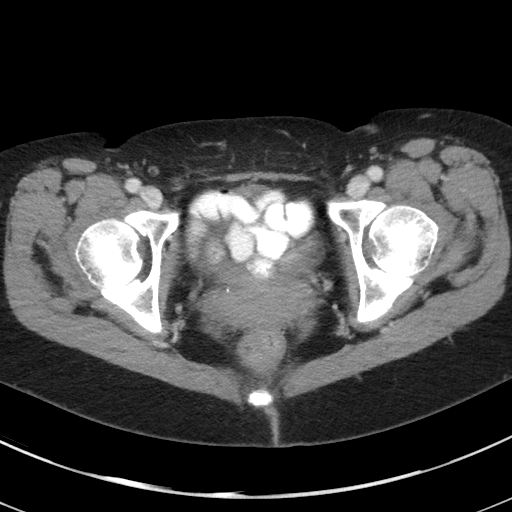
[im 26/85  soft-tissue]
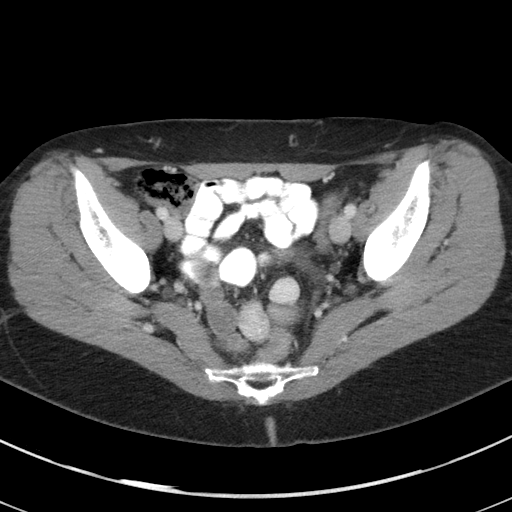
[im 30/85  soft-tissue]
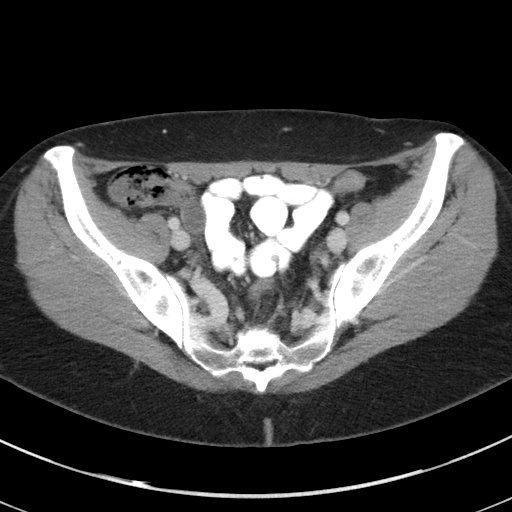
[im 38/85  soft-tissue]
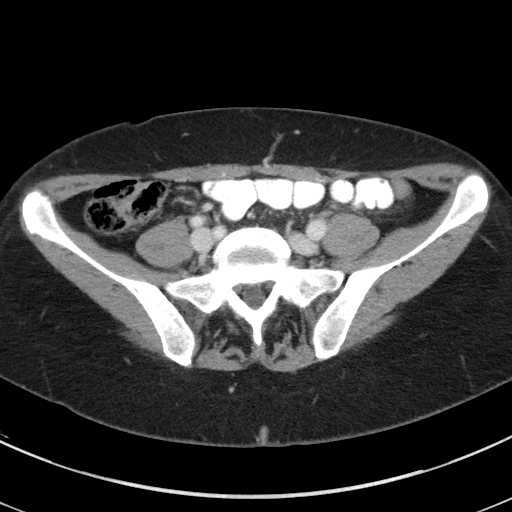
[im 43/85  soft-tissue]
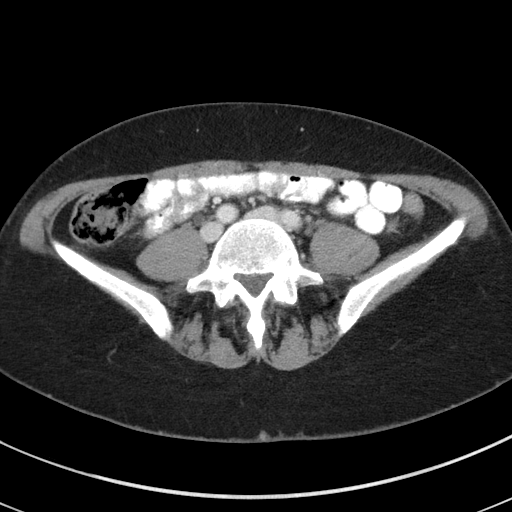
[im 47/85  soft-tissue]
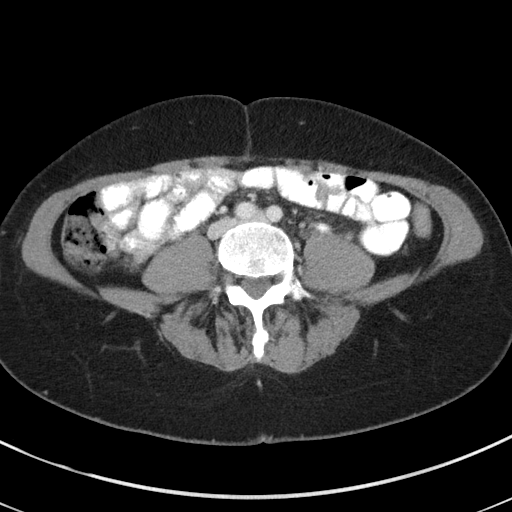
[im 55/85  soft-tissue]
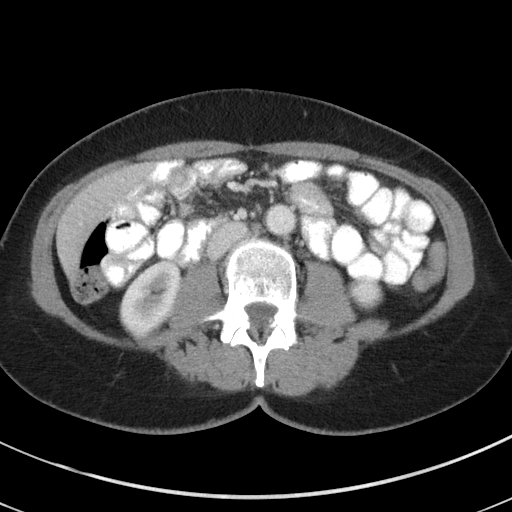
[im 55/85  bone]
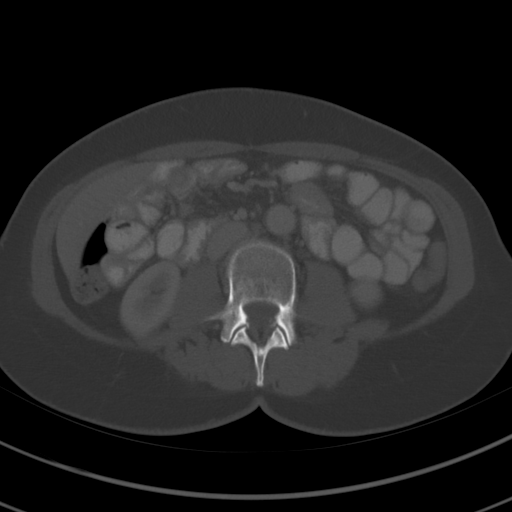
[im 59/85  soft-tissue]
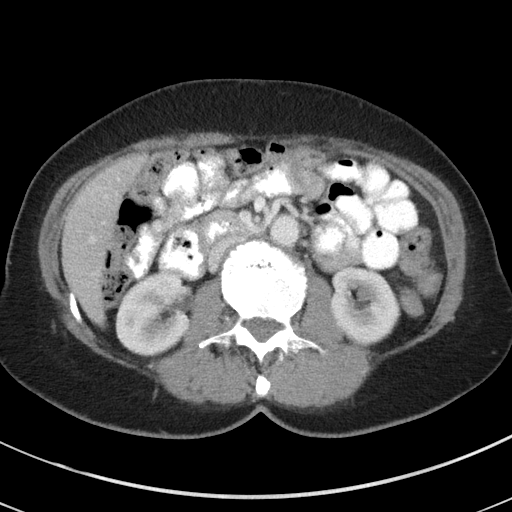
[im 68/85  soft-tissue]
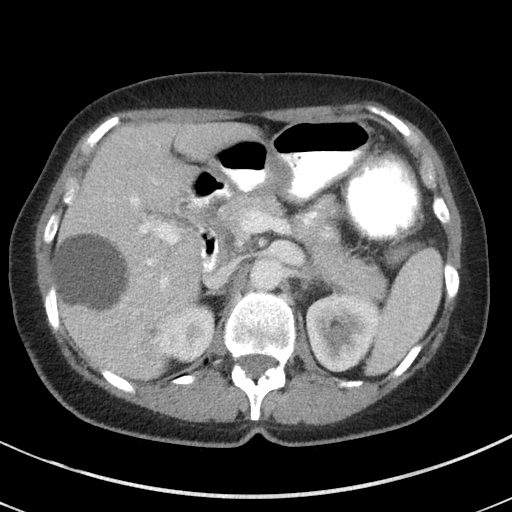
[im 68/85  lung]
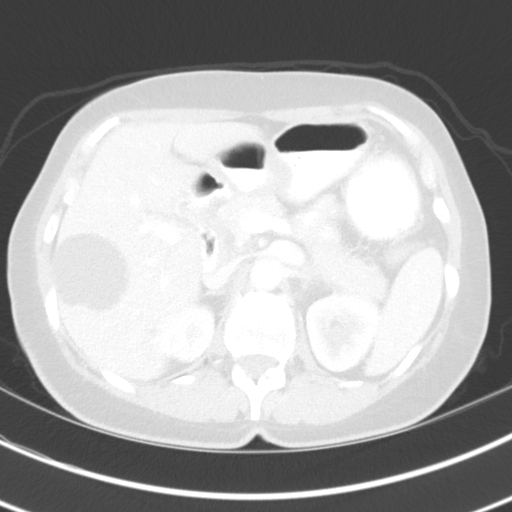
[im 72/85  soft-tissue]
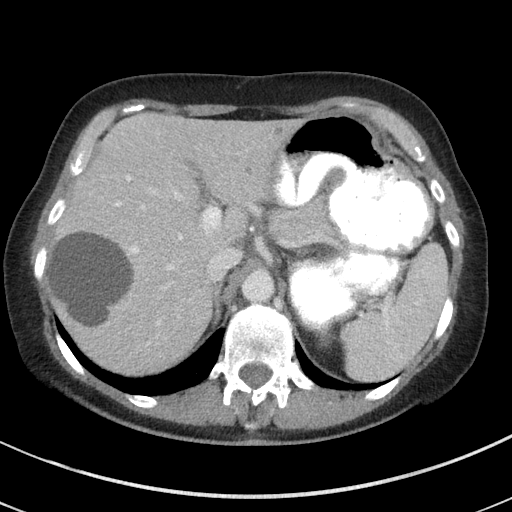
[im 72/85  lung]
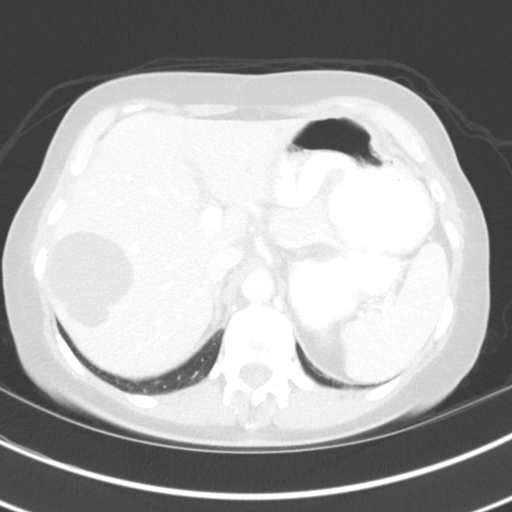
[im 76/85  lung]
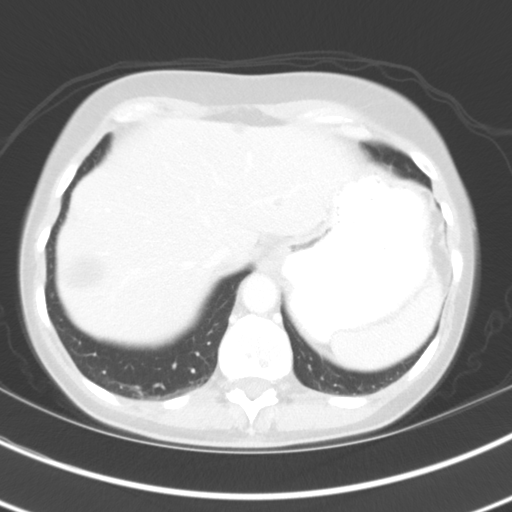
[im 80/85  soft-tissue]
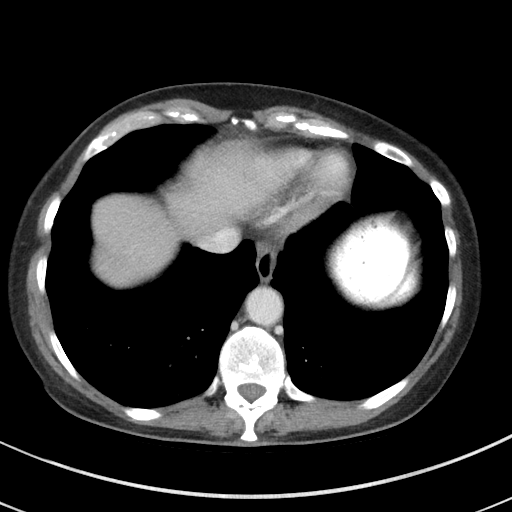
[im 80/85  lung]
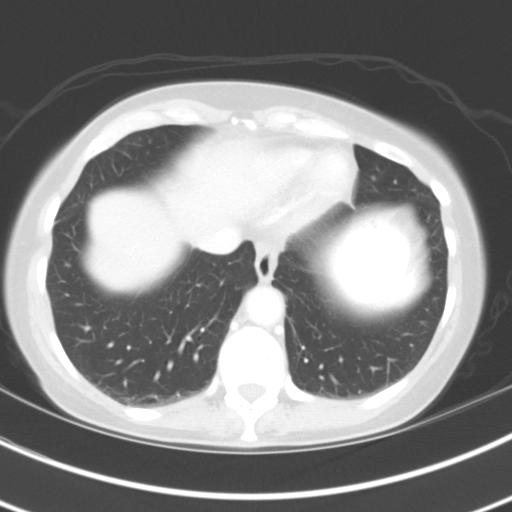

[14 of 32 positions shown; findings below may reference images not displayed]

FINDINGS: Lower chest: No acute abnormality.

Hepatobiliary: Hepatic cysts are again identified and stable. The
gallbladder is within normal limits.

Pancreas: Unremarkable. No pancreatic ductal dilatation or
surrounding inflammatory changes.

Spleen: Normal in size without focal abnormality.

Adrenals/Urinary Tract: Adrenal glands are unremarkable. Kidneys are
normal, without renal calculi, focal lesion, or hydronephrosis.
Bladder is unremarkable.

Stomach/Bowel: The appendix has been surgically removed. No
obstructive or inflammatory changes of the small bowel colon are
noted. Stomach is within normal limits.

Vascular/Lymphatic: No significant vascular findings are present. No
enlarged abdominal or pelvic lymph nodes.

Reproductive: Status post hysterectomy. No adnexal masses.

Other: No abdominal wall hernia or abnormality. No abdominopelvic
ascites.

Musculoskeletal: Mild degenerative changes of lumbar spine are seen.
No acute bony abnormality is noted.
IMPRESSION: Postsurgical changes.  No acute abnormality noted.

Stable hepatic cysts.

## 2019-07-26 NOTE — Progress Notes (Signed)
PCP: Rusty Aus, MD   Chief Complaint  Patient presents with  . Gynecologic Exam    HPI:      [name] is a 63 y.o. G2P2 who LMP was No LMP recorded. Patient has had a hysterectomy., presents today for her NP> 3 yrs annual examination.  Her menses are absent due to menopause/hyst. She does not have postmenopausal bleeding. She does not have vasomotor sx--doing estrogen pellets and prog caps.   Sex activity: single partner, contraception - post menopausal status. She does not have vaginal dryness.  Last Pap: December 24, 2013  Results were: no abnormalities /neg HPV DNA.   Last mammogram: last yr at [facility];  Results were: normal--routine follow-up in 12 months There is a FH of breast cancer in her MGM, genetic testing not indicated. There is no FH of ovarian cancer. The patient does do self-breast exams.  Colonoscopy: 2010 with Dr. Vira Agar; Repeat due this yr, postponed till next yr due to covid.  DEXA: WNL 2018 at [facility], repeat in 5 yrs  Tobacco use: The patient denies current or previous tobacco use. Alcohol use: social drinker  No drug use.  Exercise: moderately active  She does get adequate calcium and Vitamin D in her diet.  Labs with PCP.   Patient Active Problem List   Diagnosis Date Noted  . Mitral valve prolapse 05/31/2016  . Postmenopausal 05/31/2016  . Mastalgia 07/31/2015  . Benign liver cyst 10/25/2014    Past Surgical History:  Procedure Laterality Date  . BREAST BIOPSY Right 02-20-00   cyst  . COLONOSCOPY  2010   Dr Vira Agar  . FOOT SURGERY Right   . LAPAROSCOPIC HYSTERECTOMY  1985   age 64  . OOPHORECTOMY  1995  . PLACEMENT OF BREAST IMPLANTS  2006   [place], [place]. Mentor saline implants.     Family History  Problem Relation Age of Onset  . Aortic aneurysm Father 45  . Breast cancer Maternal Grandmother 60  . Osteoporosis Sister   . Uterine cancer Maternal Aunt 64    Social History   Socioeconomic History  . Marital  status: Married    Spouse name: Not on file  . Number of children: Not on file  . Years of education: Not on file  . Highest education level: Not on file  Occupational History  . Not on file  Social Needs  . Financial resource strain: Not on file  . Food insecurity    Worry: Not on file    Inability: Not on file  . Transportation needs    Medical: Not on file    Non-medical: Not on file  Tobacco Use  . Smoking status: Never Smoker  . Smokeless tobacco: Never Used  Substance and Sexual Activity  . Alcohol use: Yes    Alcohol/week: 0.0 standard drinks    Comment: occasionally wine 1/week  . Drug use: Never  . Sexual activity: Yes    Birth control/protection: Surgical    Comment: Hysterectomy  Lifestyle  . Physical activity    Days per week: Not on file    Minutes per session: Not on file  . Stress: Not on file  Relationships  . Social Herbalist on phone: Not on file    Gets together: Not on file    Attends religious service: Not on file    Active member of club or organization: Not on file    Attends meetings of clubs or organizations: Not  on file    Relationship status: Not on file  . Intimate partner violence    Fear of current or ex partner: Not on file    Emotionally abused: Not on file    Physically abused: Not on file    Forced sexual activity: Not on file  Other Topics Concern  . Not on file  Social History Narrative  . Not on file     Current Outpatient Medications:  .  Biotin 1000 MCG tablet, Take by mouth., Disp: , Rfl:  .  Cholecalciferol (VITAMIN D) 2000 units tablet, Take by mouth., Disp: , Rfl:  .  Cholecalciferol (VITAMIN D-3) 1000 UNITS CAPS, Take by mouth., Disp: , Rfl:  .  clindamycin (CLEOCIN T) 1 % lotion, APPLY A THIN LAYER TO FACE TWICE A DAY AS DIRECTED, Disp: , Rfl:  .  ibuprofen (ADVIL) 800 MG tablet, Take by mouth., Disp: , Rfl:  .  progesterone (PROMETRIUM) 200 MG capsule, , Disp: , Rfl:  .  spironolactone (ALDACTONE) 100  MG tablet, Take 100 mg by mouth daily., Disp: , Rfl:  .  vitamin B-12 (CYANOCOBALAMIN) 100 MCG tablet, Take 100 mcg by mouth daily., Disp: , Rfl:   Current Facility-Administered Medications:  .  betamethasone acetate-betamethasone sodium phosphate (CELESTONE) injection 12 mg, 12 mg, Intramuscular, Once, Evans, Brent M, DPM     ROS:  Review of Systems  Constitutional: Negative for fatigue, fever and unexpected weight change.  Respiratory: Negative for cough, shortness of breath and wheezing.   Cardiovascular: Negative for chest pain, palpitations and leg swelling.  Gastrointestinal: Negative for blood in stool, constipation, diarrhea, nausea and vomiting.  Endocrine: Negative for cold intolerance, heat intolerance and polyuria.  Genitourinary: Negative for dyspareunia, dysuria, flank pain, frequency, genital sores, hematuria, menstrual problem, pelvic pain, urgency, vaginal bleeding, vaginal discharge and vaginal pain.  Musculoskeletal: Negative for back pain, joint swelling and myalgias.  Skin: Negative for rash.  Neurological: Negative for dizziness, syncope, light-headedness, numbness and headaches.  Hematological: Negative for adenopathy.  Psychiatric/Behavioral: Negative for agitation, confusion, sleep disturbance and suicidal ideas. The patient is not nervous/anxious.   BREAST: No symptoms   Objective: BP 120/70   Ht 5\' 4"  (1.626 m)   Wt 149 lb (67.6 kg)   BMI 25.58 kg/m    Physical Exam Constitutional:      Appearance: She is well-developed.  Genitourinary:     Vulva, urethra, vagina and left adnexa normal.     No vaginal discharge, erythema or tenderness.     Cervix is absent.     Uterus is absent.     No right or left adnexal mass present.     Right adnexa absent.     Right adnexa not tender.     Left adnexa not tender.     Genitourinary Comments: UTERUS/CX SURG REM  Neck:     Musculoskeletal: Normal range of motion.     Thyroid: No thyromegaly.   Cardiovascular:     Rate and Rhythm: Normal rate and regular rhythm.     Heart sounds: Normal heart sounds. No murmur.  Pulmonary:     Effort: Pulmonary effort is normal.     Breath sounds: Normal breath sounds.  Chest:     Breasts:        Right: No mass, nipple discharge, skin change or tenderness.        Left: No mass, nipple discharge, skin change or tenderness.  Abdominal:     Palpations: Abdomen is soft.  Tenderness: There is no abdominal tenderness. There is no guarding.  Musculoskeletal: Normal range of motion.  Neurological:     General: No focal deficit present.     Mental Status: She is alert and oriented to person, place, and time.     Cranial Nerves: No cranial nerve deficit.  Skin:    General: Skin is warm and dry.  Psychiatric:        Mood and Affect: Mood normal.        Behavior: Behavior normal.        Thought Content: Thought content normal.        Judgment: Judgment normal.  Vitals signs reviewed.     Assessment/Plan:  Encounter for annual routine gynecological examination  Cervical cancer screening - Plan: [facility] PAP  Screening for HPV (human papillomavirus) - Plan: [facility] PAP  Encounter for screening mammogram for malignant neoplasm of breast - Plan: 3D MAMMOGRAM SCREENING BILATERAL; pt sched mammos at [facility] counsel breast self exam, mammography screening, menopause, adequate intake of calcium and vitamin D, diet and exercise    F/U  Return in about 1 year (around 07/26/2020).  Ren Grasse B. Ramell Wacha, PA-C 07/27/2019 8:36 AM

## 2019-07-27 ENCOUNTER — Other Ambulatory Visit (HOSPITAL_COMMUNITY)
Admission: RE | Admit: 2019-07-27 | Discharge: 2019-07-27 | Disposition: A | Discharge status: Home / Self Care | Payer: BC Managed Care – PPO | Source: Ambulatory Visit | Attending: Obstetrics and Gynecology | Admitting: Obstetrics and Gynecology

## 2019-07-27 ENCOUNTER — Other Ambulatory Visit: Payer: Self-pay

## 2019-07-27 ENCOUNTER — Encounter: Payer: Self-pay | Admitting: Obstetrics and Gynecology

## 2019-07-27 ENCOUNTER — Ambulatory Visit (INDEPENDENT_AMBULATORY_CARE_PROVIDER_SITE_OTHER): Payer: BC Managed Care – PPO | Admitting: Obstetrics and Gynecology

## 2019-07-27 VITALS — BP 120/70 | Ht 64.0 in | Wt 149.0 lb

## 2019-07-27 DIAGNOSIS — Z124 Encounter for screening for malignant neoplasm of cervix: Secondary | ICD-10-CM

## 2019-07-27 DIAGNOSIS — Z1151 Encounter for screening for human papillomavirus (HPV): Secondary | ICD-10-CM | POA: Diagnosis present

## 2019-07-27 DIAGNOSIS — Z1231 Encounter for screening mammogram for malignant neoplasm of breast: Secondary | ICD-10-CM

## 2019-07-27 DIAGNOSIS — Z01419 Encounter for gynecological examination (general) (routine) without abnormal findings: Secondary | ICD-10-CM | POA: Diagnosis not present

## 2019-07-27 NOTE — Patient Instructions (Signed)
I value your feedback and entrusting us with your care. If you get a [facility] patient survey, I would appreciate you taking the time to let us know about your experience today. Thank you!  [facility]: [phone]  [facility]: [phone]  

## 2019-07-28 LAB — CYTOLOGY - PAP
Comment: NEGATIVE
Diagnosis: NEGATIVE
High risk HPV: NEGATIVE

## 2019-12-21 ENCOUNTER — Ambulatory Visit (INDEPENDENT_AMBULATORY_CARE_PROVIDER_SITE_OTHER): Payer: Medicare Other | Admitting: Dermatology

## 2019-12-21 ENCOUNTER — Encounter: Payer: Self-pay | Admitting: Dermatology

## 2019-12-21 ENCOUNTER — Other Ambulatory Visit: Payer: Self-pay

## 2019-12-21 DIAGNOSIS — K130 Diseases of lips: Secondary | ICD-10-CM

## 2019-12-21 DIAGNOSIS — L578 Other skin changes due to chronic exposure to nonionizing radiation: Secondary | ICD-10-CM | POA: Diagnosis not present

## 2019-12-21 DIAGNOSIS — L308 Other specified dermatitis: Secondary | ICD-10-CM | POA: Diagnosis not present

## 2019-12-21 DIAGNOSIS — L988 Other specified disorders of the skin and subcutaneous tissue: Secondary | ICD-10-CM | POA: Diagnosis not present

## 2019-12-21 DIAGNOSIS — L821 Other seborrheic keratosis: Secondary | ICD-10-CM

## 2019-12-21 MED ORDER — HYDROCORTISONE 2.5 % EX CREA: TOPICAL_CREAM | CUTANEOUS | 2 refills | Status: AC

## 2019-12-21 NOTE — Progress Notes (Signed)
Follow-Up Visit   Subjective  Rachel Dorsey is a 64 y.o. female who presents for the following: Eczema (Hands, patient has had for years but was not symptomatic until recently, maybe with hand sanitizer use. She is using betamethasone cream on them and has improved.  Also using Eucerin. ).  Patient has a spot on right cheek, has been present for years. It has been treated with LN2 in the past but has recurred.  No personal or family history of skin cancer.   Patient concerned about overall skin care.   The following portions of the chart were reviewed this encounter and updated as appropriate:     Review of Systems: No other skin or systemic complaints.  Objective  Well appearing patient in no apparent distress; mood and affect are within normal limits.  A focused examination was performed including face, ears, neck, lips, nose, bilateral hands. Relevant physical exam findings are noted in the Assessment and Plan.  Objective  Lips: Hx erythema and peeling  Objective  face: Rhytides and volume loss.   Objective  hands: Scaly erythematous papules and patches +/- dyspigmentation, lichenification, excoriations.   Objective  Right  Cheek: Stuck-on, waxy, tan-brown papules and plaques -- Discussed benign etiology and prognosis.   Assessment & Plan  Cheilitis Lips  Recurrent D/C betamethasone. Reviewed this is too strong for the area.   Recommend Dr. Luvenia Heller CortiBalm Start HC 2.5% ointment to lips twice a day for 1 week PRN.   Topical steroids (such as triamcinolone, fluocinolone, fluocinonide, mometasone, clobetasol, halobetasol, betamethasone, hydrocortisone) can cause thinning and lightening of the skin if they are used for too long in the same area. Your physician has selected the right strength medicine for your problem and area affected on the body. Please use your medication only as directed by your physician to prevent side effects.    hydrocortisone 2.5 % cream  - Lips  Elastosis of skin face  Recommend Alastin Restorative Skin Cream, Alastin Eye Cream  Discussed retinoid but defer due to sensitive skin.  Recommend daily broad spectrum sunscreen SPF 30+ to sun-exposed areas, reapply every 2 hours as needed. Call for new or changing lesions.   Other eczema hands  Chronic, currently flared Cont betamethasone diproprionate BID PRN. Pt has.  Recommend Gentle Skin Care  Topical steroids (such as triamcinolone, fluocinolone, fluocinonide, mometasone, clobetasol, halobetasol, betamethasone, hydrocortisone) can cause thinning and lightening of the skin if they are used for too long in the same area. Your physician has selected the right strength medicine for your problem and area affected on the body. Please use your medication only as directed by your physician to prevent side effects.    Seborrheic keratosis Right  Cheek  Cosmetic treatment.   Prior to procedure, discussed risks of blister formation, small wound, skin dyspigmentation, or rare scar following cryotherapy.   Patient advised to RTC within a few months since it may take more then 1 treatment.    Destruction of lesion - Right  Cheek  Destruction method: cryotherapy   Informed consent: discussed and consent obtained   Lesion destroyed using liquid nitrogen: Yes   Cryotherapy cycles:  2 Outcome: patient tolerated procedure well with no complications   Post-procedure details: wound care instructions given    Actinic Damage - diffuse scaly erythematous macules with underlying dyspigmentation - Recommend daily broad spectrum sunscreen SPF 30+ to sun-exposed areas, reapply every 2 hours as needed.  - Call for new or changing lesions.   Return if  symptoms worsen or fail to improve.   Graciella Belton, RMA, am acting as scribe for Forest Gleason, MD .  Documentation: I have reviewed the above documentation for accuracy and completeness, and I agree with the above.  Forest Gleason, MD

## 2019-12-21 NOTE — Patient Instructions (Addendum)
Cryotherapy Aftercare  . Wash gently with soap and water everyday.   Marland Kitchen Apply Vaseline and Band-Aid daily until healed.  Gentle Skin Care Guide  1. Bathe no more than once a day.  2. Avoid bathing in hot water  3. Use a mild soap like Dove, Vanicream, Cetaphil, CeraVe. Can use Lever 2000 or       Cetaphil antibacterial soap  4. Use soap only where you need it. On most days, use it under your arms, between       your legs, and on your fee. Let the water rinse other areas unless visibly dirty.  5. When you get out of the bath/shower, use a towel to gently blot your skin dry, don't           rub it.  6. While your skin is still a little damp, apply a moisturizing cream such as Vanicream,       CeraVe, Cetaphil, Eucerin, Sarna lotion or plain Vaseline Jelly. For hands apply        Neutrogena [place] Hand Cream or Excipial Hand Cream.  7. Reapply moisturizer any time you start to itch or feel dry.  8. Sometimes using free and clear laundry detergents can be helpful. Fabric softener       sheets should be avoided. Downy Free & Gentle liquid, or any liquid fabric softener       that is free of dyes and perfumes, it acceptable to use  9. If your doctor has given you prescription creams you may apply moisturizers over       them   Topical steroids (such as triamcinolone, fluocinolone, fluocinonide, mometasone, clobetasol, halobetasol, betamethasone, hydrocortisone) can cause thinning and lightening of the skin if they are used for too long in the same area. Your physician has selected the right strength medicine for your problem and area affected on the body. Please use your medication only as directed by your physician to prevent side effects.   Recommend daily broad spectrum sunscreen SPF 30+ to sun-exposed areas, reapply every 2 hours as needed. Call for new or changing lesions. Consider HelioCare Regular or Ultra for added sun protection.

## 2020-02-03 ENCOUNTER — Other Ambulatory Visit: Payer: Self-pay | Admitting: Internal Medicine

## 2020-02-03 DIAGNOSIS — E782 Mixed hyperlipidemia: Secondary | ICD-10-CM

## 2020-02-03 DIAGNOSIS — K7689 Other specified diseases of liver: Secondary | ICD-10-CM

## 2020-02-16 ENCOUNTER — Other Ambulatory Visit: Payer: Self-pay

## 2020-02-16 ENCOUNTER — Ambulatory Visit
Admission: RE | Admit: 2020-02-16 | Discharge: 2020-02-16 | Disposition: A | Discharge status: Home / Self Care | Payer: Medicare Other | Source: Ambulatory Visit | Attending: Internal Medicine | Admitting: Internal Medicine

## 2020-02-16 DIAGNOSIS — E782 Mixed hyperlipidemia: Secondary | ICD-10-CM

## 2020-02-16 DIAGNOSIS — K7689 Other specified diseases of liver: Secondary | ICD-10-CM | POA: Diagnosis not present

## 2020-06-08 NOTE — Progress Notes (Signed)
Rusty Aus, MD   Chief Complaint  Patient presents with   Breast exam    itching and bumps near nipple x 2 weeks    HPI:      [name] is a 64 y.o. [id number] whose LMP was No LMP recorded. Patient has had a hysterectomy., presents today for itchy bumps on bilat nipples, L>R for a couple wks. No masses. Used some Rx cortisone crm that caused burning. Pt has been in damp sports bras and damp bathing suits this summer. Hx of eczema. Last mammo 6/21 at [facility] per pt.  There is a FH of breast cancer in her MGM.  Past Medical History:  Diagnosis Date   Endometriosis    Family history of breast cancer    Postmenopausal atrophic vaginitis     Past Surgical History:  Procedure Laterality Date   BREAST BIOPSY Right 02-20-00   cyst   COLONOSCOPY  2010   Dr Vira Agar   FOOT SURGERY Right    LAPAROSCOPIC HYSTERECTOMY  1985   age 27   [place]  2006   [place], [place]. Mentor saline implants.     Family History  Problem Relation Age of Onset   Aortic aneurysm Father 32   Breast cancer Maternal Grandmother 81   Osteoporosis Sister    Uterine cancer Maternal Aunt 64    Social History   Socioeconomic History   Marital status: Married    Spouse name: Not on file   Number of children: Not on file   Years of education: Not on file   Highest education level: Not on file  Occupational History   Not on file  Tobacco Use   Smoking status: Never Smoker   Smokeless tobacco: Never Used  Vaping Use   Vaping Use: Never used  Substance and Sexual Activity   Alcohol use: Yes    Alcohol/week: 0.0 standard drinks    Comment: occasionally wine 1/week   Drug use: Never   Sexual activity: Yes    Birth control/protection: Surgical    Comment: Hysterectomy  Other Topics Concern   Not on file  Social History Narrative   Not on file   Social Determinants of Health   Financial Resource Strain:     Difficulty of Paying Living Expenses: Not on file  Food Insecurity:    Worried About [place] in the Last Year: Not on file   [employer] of Food in the Last Year: Not on file  Transportation Needs:    Lack of Transportation (Medical): Not on file   Lack of Transportation (Non-Medical): Not on file  Physical Activity:    Days of Exercise per Week: Not on file   Minutes of Exercise per Session: Not on file  Stress:    Feeling of Stress : Not on file  Social Connections:    Frequency of Communication with Friends and Family: Not on file   Frequency of Social Gatherings with Friends and Family: Not on file   Attends Religious Services: Not on file   Active Member of Clubs or Organizations: Not on file   Attends Archivist Meetings: Not on file   Marital Status: Not on file  Intimate Partner Violence:    Fear of Current or Ex-Partner: Not on file   Emotionally Abused: Not on file   Physically Abused: Not on file   Sexually Abused: Not on file  Outpatient Medications Prior to Visit  Medication Sig Dispense Refill   Biotin 1000 MCG tablet Take by mouth.     Cholecalciferol (VITAMIN D) 2000 units tablet Take by mouth.     hydrocortisone 2.5 % cream Apply to lip twice a day for up to one week as needed. 28 g 2   progesterone (PROMETRIUM) 200 MG capsule      vitamin B-12 (CYANOCOBALAMIN) 100 MCG tablet Take 100 mcg by mouth daily.     Cholecalciferol (VITAMIN D-3) 1000 UNITS CAPS Take by mouth.     clindamycin (CLEOCIN T) 1 % lotion APPLY A THIN LAYER TO FACE TWICE A DAY AS DIRECTED     ibuprofen (ADVIL) 800 MG tablet Take by mouth.     spironolactone (ALDACTONE) 100 MG tablet Take 100 mg by mouth daily.     Facility-Administered Medications Prior to Visit  Medication Dose Route Frequency Provider Last Rate Last Admin   betamethasone acetate-betamethasone sodium phosphate (CELESTONE) injection 12 mg  12 mg Intramuscular Once Daylene Katayama M,  DPM          ROS:  Review of Systems  Constitutional: Negative for fever.  Gastrointestinal: Negative for blood in stool, constipation, diarrhea, nausea and vomiting.  Genitourinary: Negative for dyspareunia, dysuria, flank pain, frequency, hematuria, urgency, vaginal bleeding, vaginal discharge and vaginal pain.  Musculoskeletal: Negative for back pain.  Skin: Negative for rash.   BREAST: rash   OBJECTIVE:   Vitals:  BP 120/70    Ht 5\' 4"  (1.626 m)    Wt 156 lb (70.8 kg)    BMI 26.78 kg/m   Physical Exam Vitals reviewed.  Pulmonary:     Effort: Pulmonary effort is normal.  Chest:     Breasts: Breasts are symmetrical.        Right: No inverted nipple, mass, nipple discharge, skin change or tenderness.        Left: No inverted nipple, mass, nipple discharge, skin change or tenderness.       Comments: DIFFUSE ERYTHEMATOUS PAPULES BILAT AREOLA WITH SCALE ON RT SIDE Musculoskeletal:        General: Normal range of motion.     Cervical back: Normal range of motion.  Skin:    General: Skin is warm and dry.  Neurological:     General: No focal deficit present.     Mental Status: She is alert and oriented to person, place, and time.     Cranial Nerves: No cranial nerve deficit.  Psychiatric:        Mood and Affect: Mood normal.        Behavior: Behavior normal.        Thought Content: Thought content normal.        Judgment: Judgment normal.     Assessment/Plan: Tinea corporis--recommended OTC hydrocortisone crm ext BID for 2-4 wks. Discussed lotrisone crm but think OTC med better tx. Keep areas dry. F/u prn.   Normal breast exam--no masses bilat.   Return if symptoms worsen or fail to improve.  Shailynn Fong B. Jazaria Jarecki, PA-C 06/13/2020 9:44 AM

## 2020-06-12 ENCOUNTER — Other Ambulatory Visit: Payer: Self-pay

## 2020-06-12 ENCOUNTER — Ambulatory Visit (INDEPENDENT_AMBULATORY_CARE_PROVIDER_SITE_OTHER): Payer: Medicare Other | Admitting: Obstetrics and Gynecology

## 2020-06-12 VITALS — BP 120/70 | Ht 64.0 in | Wt 156.0 lb

## 2020-06-12 DIAGNOSIS — Z0000 Encounter for general adult medical examination without abnormal findings: Secondary | ICD-10-CM

## 2020-06-12 DIAGNOSIS — B354 Tinea corporis: Secondary | ICD-10-CM | POA: Diagnosis not present

## 2020-06-13 ENCOUNTER — Encounter: Payer: Self-pay | Admitting: Obstetrics and Gynecology

## 2020-06-13 NOTE — Patient Instructions (Signed)
I value your feedback and entrusting us with your care. If you get a [facility] patient survey, I would appreciate you taking the time to let us know about your experience today. Thank you!  As of August 26, 2019, your lab results will be released to your MyChart immediately, before I even have a chance to see them. Please give me time to review them and contact you if there are any abnormalities. Thank you for your patience.  

## 2020-07-31 ENCOUNTER — Ambulatory Visit: Payer: Medicare Other | Admitting: Obstetrics and Gynecology

## 2020-12-07 ENCOUNTER — Other Ambulatory Visit: Payer: Self-pay

## 2020-12-07 ENCOUNTER — Ambulatory Visit (INDEPENDENT_AMBULATORY_CARE_PROVIDER_SITE_OTHER): Payer: Medicare Other | Admitting: Dermatology

## 2020-12-07 DIAGNOSIS — L821 Other seborrheic keratosis: Secondary | ICD-10-CM

## 2020-12-07 DIAGNOSIS — L578 Other skin changes due to chronic exposure to nonionizing radiation: Secondary | ICD-10-CM

## 2020-12-07 DIAGNOSIS — L720 Epidermal cyst: Secondary | ICD-10-CM

## 2020-12-07 DIAGNOSIS — D229 Melanocytic nevi, unspecified: Secondary | ICD-10-CM

## 2020-12-07 DIAGNOSIS — L259 Unspecified contact dermatitis, unspecified cause: Secondary | ICD-10-CM

## 2020-12-07 DIAGNOSIS — D1800 Hemangioma unspecified site: Secondary | ICD-10-CM

## 2020-12-07 NOTE — Patient Instructions (Signed)
Melanoma ABCDEs  Melanoma is the most dangerous type of skin cancer, and is the leading cause of death from skin disease.  You are more likely to develop melanoma if you: Have light-colored skin, light-colored eyes, or red or blond hair Spend a lot of time in the sun Tan regularly, either outdoors or in a tanning bed Have had blistering sunburns, especially during childhood Have a close family member who has had a melanoma Have atypical moles or large birthmarks  Early detection of melanoma is key since treatment is typically straightforward and cure rates are extremely high if we catch it early.   The first sign of melanoma is often a change in a mole or a new dark spot.  The ABCDE system is a way of remembering the signs of melanoma.  A for asymmetry:  The two halves do not match. B for border:  The edges of the growth are irregular. C for color:  A mixture of colors are present instead of an even brown color. D for diameter:  Melanomas are usually (but not always) greater than 6mm - the size of a pencil eraser. E for evolution:  The spot keeps changing in size, shape, and color.  Please check your skin once per month between visits. You can use a small mirror in front and a large mirror behind you to keep an eye on the back side or your body.   If you see any new or changing lesions before your next follow-up, please call to schedule a visit.  Please continue daily skin protection including broad spectrum sunscreen SPF 30+ to sun-exposed areas, reapplying every 2 hours as needed when you're outdoors.   Staying in the shade or wearing long sleeves, sun glasses (UVA+UVB protection) and wide brim hats (4-inch brim around the entire circumference of the hat) are also recommended for sun protection.    Recommend taking Heliocare sun protection supplement daily in sunny weather for additional sun protection. For maximum protection on the sunniest days, you can take up to 2 capsules of  regular Heliocare OR take 1 capsule of Heliocare Ultra. For prolonged exposure (such as a full day in the sun), you can repeat your dose of the supplement 4 hours after your first dose. Heliocare can be purchased at [facility] or at www.heliocare.com.    If you have any questions or concerns for your doctor, please call our main line at [phone] and press option 4 to reach your doctor's medical assistant. If no one answers, please leave a voicemail as directed and we will return your call as soon as possible. Messages left after 4 pm will be answered the following business day.   You may also send us a message via MyChart. We typically respond to MyChart messages within 1-2 business days.  For prescription refills, please ask your pharmacy to contact our office. Our fax number is [phone].  If you have an urgent issue when the clinic is closed that cannot wait until the next business day, you can page your doctor at the number below.    Please note that while we do our best to be available for urgent issues outside of office hours, we are not available 24/7.   If you have an urgent issue and are unable to reach us, you may choose to seek medical care at your doctor's office, retail clinic, urgent care center, or emergency room.  If you have a medical emergency, please immediately call 911 or go to   the emergency department.  Pager Numbers  - Dr. Kowalski: [phone]  - Dr. Moye: [phone]  - Dr. Stewart: [phone]  In the event of inclement weather, please call our main line at [phone] for an update on the status of any delays or closures.  Dermatology Medication Tips: Please keep the boxes that topical medications come in in order to help keep track of the instructions about where and how to use these. Pharmacies typically print the medication instructions only on the boxes and not directly on the medication tubes.   If your medication is too expensive,  please contact our office at [phone] option 4 or send us a message through MyChart.   We are unable to tell what your co-pay for medications will be in advance as this is different depending on your insurance coverage. However, we may be able to find a substitute medication at lower cost or fill out paperwork to get insurance to cover a needed medication.   If a prior authorization is required to get your medication covered by your insurance company, please allow us 1-2 business days to complete this process.  Drug prices often vary depending on where the prescription is filled and some pharmacies may offer cheaper prices.  The website www.goodrx.com contains coupons for medications through different pharmacies. The prices here do not account for what the cost may be with help from insurance (it may be cheaper with your insurance), but the website can give you the price if you did not use any insurance.  - You can print the associated coupon and take it with your prescription to the pharmacy.  - You may also stop by our office during regular business hours and pick up a GoodRx coupon card.  - If you need your prescription sent electronically to a different pharmacy, notify our office through [place] MyChart or by phone at [phone] option 4.  

## 2020-12-07 NOTE — Progress Notes (Signed)
Follow-Up Visit   Subjective  Rachel Dorsey is a 65 y.o. female who presents for the following: Follow-up (Patient reports she noticed a new spot develop on right upper leg above knee. She noticed spot in January. Patient reports it was scaly and raised. She has been using moisturizers on area and now it is only slightly raised. Patient reports she has a spot on back she noticed and has grown larger. Spot is more like a lump. Patient reports spot on right cheek that was froze last year has came back. ).  Patient here for full body skin exam and skin cancer screening.  The following portions of the chart were reviewed this encounter and updated as appropriate:  Tobacco  Allergies  Meds  Problems  Med Hx  Surg Hx  Fam Hx       Objective  Well appearing patient in no apparent distress; mood and affect are within normal limits.  A full examination was performed including scalp, head, eyes, ears, nose, lips, neck, chest, axillae, abdomen, back, buttocks, bilateral upper extremities, bilateral lower extremities, hands, feet, fingers, toes, fingernails, and toenails. All findings within normal limits unless otherwise noted below.  Objective  Mid Back: Subcutaneous nodule   Objective  Left Forearm - Posterior: Clear today  Assessment & Plan  Epidermal inclusion cyst Mid Back  Benign-appearing. Exam most consistent with an epidermal inclusion cyst. Discussed that a cyst is a benign growth that can grow over time and sometimes get irritated or inflamed. Recommend observation if it is not bothersome. Discussed option of surgical excision to remove it if it is growing, symptomatic, or other changes noted. Please call for new or changing lesions so they can be evaluated.    Contact dermatitis, unspecified contact dermatitis type, unspecified trigger Left Forearm - Posterior  History of rash repeatedly after exposure to different self tanners over years  Chronic condition, no cure,  depends on avoidance of trigger. Discussed option of patch testing.  Use prescription Triamcinolone 0.1% cream twice daily as needed for up to 2 weeks for occurrence (Pt already has at home) Avoid applying to face, groin, and axilla. Use as directed. Risk of skin atrophy with long-term use reviewed.   Could do open patch testing of Jergen Glow to inner arm. Discussed procedure today.  Topical steroids (such as triamcinolone, fluocinolone, fluocinonide, mometasone, clobetasol, halobetasol, betamethasone, hydrocortisone) can cause thinning and lightening of the skin if they are used for too long in the same area. Your physician has selected the right strength medicine for your problem and area affected on the body. Please use your medication only as directed by your physician to prevent side effects.       Seborrheic Keratoses - Stuck-on, waxy, tan-brown papules and plaques above right knee, right cheek  - Discussed benign etiology and prognosis. - Observe - Call for any changes  Hemangiomas - Red papules - Discussed benign nature - Observe - Call for any changes  Melanocytic Nevi - Tan-brown and/or pink-flesh-colored symmetric macules and papules - Benign appearing on exam today - Observation - Call clinic for new or changing moles - Recommend daily use of broad spectrum spf 30+ sunscreen to sun-exposed areas.   Actinic Damage - chronic, secondary to cumulative UV radiation exposure/sun exposure over time - diffuse scaly erythematous macules with underlying dyspigmentation - Recommend daily broad spectrum sunscreen SPF 30+ to sun-exposed areas, reapply every 2 hours as needed.  - Recommend staying in the shade or wearing long sleeves, sun  glasses (UVA+UVB protection) and wide brim hats (4-inch brim around the entire circumference of the hat). - Call for new or changing lesions.   Return if symptoms worsen or fail to improve.  I, Ruthell Rummage, CMA, am acting as scribe for  Forest Gleason, MD.  Documentation: I have reviewed the above documentation for accuracy and completeness, and I agree with the above.  Forest Gleason, MD

## 2020-12-10 ENCOUNTER — Encounter: Payer: Self-pay | Admitting: Dermatology

## 2021-10-02 ENCOUNTER — Ambulatory Visit (INDEPENDENT_AMBULATORY_CARE_PROVIDER_SITE_OTHER): Payer: Medicare Other | Admitting: Dermatology

## 2021-10-02 ENCOUNTER — Other Ambulatory Visit: Payer: Self-pay

## 2021-10-02 ENCOUNTER — Encounter: Payer: Self-pay | Admitting: Dermatology

## 2021-10-02 DIAGNOSIS — L821 Other seborrheic keratosis: Secondary | ICD-10-CM | POA: Diagnosis not present

## 2021-10-02 DIAGNOSIS — L720 Epidermal cyst: Secondary | ICD-10-CM

## 2021-10-02 DIAGNOSIS — L814 Other melanin hyperpigmentation: Secondary | ICD-10-CM

## 2021-10-02 DIAGNOSIS — L309 Dermatitis, unspecified: Secondary | ICD-10-CM | POA: Diagnosis not present

## 2021-10-02 MED ORDER — TRIAMCINOLONE ACETONIDE 0.1 % EX CREA: TOPICAL_CREAM | CUTANEOUS | 2 refills | Status: AC

## 2021-10-02 NOTE — Patient Instructions (Addendum)
Gentle Skin Care Guide  1. Bathe no more than once a day.  2. Avoid bathing in hot water  3. Use a mild soap like Dove, Vanicream, Cetaphil, CeraVe. Can use Lever 2000 or Cetaphil antibacterial soap  4. Use soap only where you need it. On most days, use it under your arms, between your legs, and on your feet. Let the water rinse other areas unless visibly dirty.  5. When you get out of the bath/shower, use a towel to gently blot your skin dry, don't rub it.  6. While your skin is still a little damp, apply a moisturizing cream such as Vanicream, CeraVe, Cetaphil, Eucerin, Sarna lotion or plain Vaseline Jelly. For hands apply Neutrogena [place] Hand Cream or Excipial Hand Cream.  7. Reapply moisturizer any time you start to itch or feel dry.  8. Sometimes using free and clear laundry detergents can be helpful. Fabric softener sheets should be avoided. Downy Free & Gentle liquid, or any liquid fabric softener that is free of dyes and perfumes, it acceptable to use  9. If your doctor has given you prescription creams you may apply moisturizers over them    Recommend OTC [place] Rapid Relief Anti-Itch cream (pramoxine + menthol), CeraVe Anti-itch cream or lotion (pramoxine), Sarna lotion (Original- menthol + camphor or Sensitive- pramoxine) or Eucerin 12 hour Itch Relief lotion (menthol) up to 3 times per day to areas on body that are itchy.  Topical steroids (such as triamcinolone, fluocinolone, fluocinonide, mometasone, clobetasol, halobetasol, betamethasone, hydrocortisone) can cause thinning and lightening of the skin if they are used for too long in the same area. Your physician has selected the right strength medicine for your problem and area affected on the body. Please use your medication only as directed by your physician to prevent side effects.    Triamcinolone cream twice daily to affected areas on body up to 2 weeks as needed for itching. Avoid applying to face, groin, and  axilla. Use as directed. Long-term use can cause thinning of the skin.     Pre-Operative Instructions  You are scheduled for a surgical procedure at [facility]. We recommend you read the following instructions. If you have any questions or concerns, please call the office at [phone].  Shower and wash the entire body with soap and water the day of your surgery paying special attention to cleansing at and around the planned surgery site.  Avoid aspirin or aspirin containing products at least fourteen (14) days prior to your surgical procedure and for at least one week (7 Days) after your surgical procedure. If you take aspirin on a regular basis for heart disease or history of stroke or for any other reason, we may recommend you continue taking aspirin but please notify us if you take this on a regular basis. Aspirin can cause more bleeding to occur during surgery as well as prolonged bleeding and bruising after surgery.   Avoid other nonsteroidal pain medications at least one week prior to surgery and at least one week prior to your surgery. These include medications such as Ibuprofen (Motrin, Advil and Nuprin), Naprosyn, Voltaren, Relafen, etc. If medications are used for therapeutic reasons, please inform us as they can cause increased bleeding or prolonged bleeding during and bruising after surgical procedures.   Please advise [place] if you are taking any "blood thinner" medications such as Coumadin or Dipyridamole or Plavix or similar medications. These cause increased bleeding and prolonged bleeding during procedures and bruising after surgical  procedures. We may have to consider discontinuing these medications briefly prior to and shortly after your surgery if safe to do so.   Please inform us of all medications you are currently taking. All medications that are taken regularly should be taken the day of surgery as you always do. Nevertheless, we need to be informed of what  medications you are taking prior to surgery to know whether they will affect the procedure or cause any complications.   Please inform us of any medication allergies. Also inform us of whether you have allergies to Latex or rubber products or whether you have had any adverse reaction to Lidocaine or Epinephrine.  Please inform us of any prosthetic or artificial body parts such as artificial heart valve, joint replacements, etc., or similar condition that might require preoperative antibiotics.   We recommend avoidance of alcohol at least two weeks prior to surgery and continued avoidance for at least two weeks after surgery.   We recommend discontinuation of tobacco smoking at least two weeks prior to surgery and continued abstinence for at least two weeks after surgery.  Do not plan strenuous exercise, strenuous work or strenuous lifting for approximately four weeks after your surgery.   We request if you are unable to make your scheduled surgical appointment, please call us at least a week in advance or as soon as you are aware of a problem so that we can cancel or reschedule the appointment.   You MAY TAKE TYLENOL (acetaminophen) for pain as it is not a blood thinner.   PLEASE PLAN TO BE IN [place]OWN FOR TWO WEEKS FOLLOWING SURGERY, THIS IS IMPORTANT SO YOU CAN BE CHECKED FOR DRESSING CHANGES, SUTURE REMOVAL AND TO MONITOR FOR POSSIBLE COMPLICATIONS.   If You Need Anything After Your Visit  If you have any questions or concerns for your doctor, please call our main line at [phone] and press option 4 to reach your doctor's medical assistant. If no one answers, please leave a voicemail as directed and we will return your call as soon as possible. Messages left after 4 pm will be answered the following business day.   You may also send [place] a message via [place]. We typically respond to MyChart messages within 1-2 business days.  For prescription refills, please ask your pharmacy to contact our  office. Our fax number is [phone].  If you have an urgent issue when the clinic is closed that cannot wait until the next business day, you can page your doctor at the number below.    Please note that while we do our best to be available for urgent issues outside of office hours, we are not available 24/7.   If you have an urgent issue and are unable to reach [place], you may choose to seek medical care at your doctor's office, retail clinic, urgent care center, or emergency room.  If you have a medical emergency, please immediately call 911 or go to the emergency department.  Pager Numbers  - Dr. Nehemiah Massed: [phone]  - Dr. Laurence Ferrari: [phone]  - Dr. Nicole Kindred: [phone]  In the event of inclement weather, please call our main line at [phone] for an update on the status of any delays or closures.  Dermatology Medication Tips: Please keep the boxes that topical medications come in in order to help keep track of the instructions about where and how to use these. Pharmacies typically print the medication instructions only on the boxes and not directly on the medication tubes.  If your medication is too expensive, please contact our office at [phone] option 4 or send [place] a message through [place].   We are unable to tell what your co-pay for medications will be in advance as this is different depending on your insurance coverage. However, we may be able to find a substitute medication at lower cost or fill out paperwork to get insurance to cover a needed medication.   If a prior authorization is required to get your medication covered by your insurance company, please allow [place] 1-2 business days to complete this process.  Drug prices often vary depending on where the prescription is filled and some pharmacies may offer cheaper prices.  The website www.goodrx.com contains coupons for medications through different pharmacies. The prices here do not account for what the cost may  be with help from insurance (it may be cheaper with your insurance), but the website can give you the price if you did not use any insurance.  - You can print the associated coupon and take it with your prescription to the pharmacy.  - You may also stop by our office during regular business hours and pick up a GoodRx coupon card.  - If you need your prescription sent electronically to a different pharmacy, notify our office through [facility] or by phone at [phone] option 4.     Si Usted Necesita Algo Despus de Su Visita  Tambin puede enviarnos un mensaje a travs de Pharmacist, community. Por lo general respondemos a los mensajes de MyChart en el transcurso de 1 a 2 das hbiles.  Para renovar recetas, por favor pida a su farmacia que se ponga en contacto con nuestra oficina. Harland Dingwall de fax es Agency [phone].  Si tiene un asunto urgente cuando la clnica est cerrada y que no puede esperar hasta el siguiente da hbil, puede llamar/localizar a su doctor(a) al nmero que aparece a continuacin.   Por favor, tenga en cuenta que aunque hacemos todo lo posible para estar disponibles para asuntos urgentes fuera del horario de [place], no estamos disponibles las 24 horas del da, los 7 das de la [place].   Si tiene un problema urgente y no puede comunicarse con nosotros, puede optar por buscar atencin mdica  en el consultorio de su doctor(a), en una clnica privada, en un centro de atencin urgente o en una sala de emergencias.  Si tiene Engineering geologist, por favor llame inmediatamente al 911 o vaya a la sala de emergencias.  Nmeros de bper  - Dr. Nehemiah Massed: [phone]  - Dra. Moye: [phone]  - Dra. Nicole Kindred: [phone]  En caso de inclemencias del [place], por favor llame a Johnsie Kindred principal al [phone] para una actualizacin sobre el [place] de cualquier retraso o cierre.  Consejos para la medicacin en dermatologa: Por favor, guarde las cajas en las  que vienen los medicamentos de uso tpico para ayudarle a seguir las instrucciones sobre dnde y cmo usarlos. Las farmacias generalmente imprimen las instrucciones del medicamento slo en las cajas y no directamente en los tubos del [place].   Si su medicamento es muy caro, por favor, pngase en contacto con Zigmund Daniel llamando al [phone] y presione la opcin 4 o envenos un mensaje a travs de Pharmacist, community.   No podemos decirle cul ser su copago por los medicamentos por adelantado ya que esto es diferente dependiendo de la cobertura de su seguro. Sin embargo, es posible que podamos encontrar un medicamento sustituto a Geneticist, molecular  formulario para que el seguro cubra el medicamento que se considera necesario.   Si se requiere una autorizacin previa para que su compaa de seguros [place] su medicamento, por favor permtanos de 1 a 2 das hbiles para completar este proceso.  Los precios de los medicamentos varan con frecuencia dependiendo del Environmental consultant de dnde se surte la receta y alguna farmacias pueden ofrecer precios ms baratos.  El sitio web www.goodrx.com tiene cupones para medicamentos de Airline pilot. Los precios aqu no tienen en cuenta lo que podra costar con la ayuda del seguro (puede ser ms barato con su seguro), pero el sitio web puede darle el precio si no utiliz Research scientist (physical sciences).  - Puede imprimir el cupn correspondiente y llevarlo con su receta a la farmacia.  - Tambin puede pasar por nuestra oficina durante el horario de atencin regular y Charity fundraiser una tarjeta de cupones de GoodRx.  - Si necesita que su receta se enve electrnicamente a una farmacia diferente, informe a nuestra oficina a travs de MyChart de [place] o por telfono llamando al [phone] y presione la opcin 4.

## 2021-10-02 NOTE — Progress Notes (Signed)
° °  Follow-Up Visit   Subjective  Rachel Dorsey is a 66 y.o. female who presents for the following: lesion (Check lump on back. Check spot on right cheek. Has had frozen in the past, always comes back. Has changed texture now, feels rough. ) and Pruritis (Back. Itching. Hx of eczema. Uses CeraVe cream, does not help much.).  The following portions of the chart were reviewed this encounter and updated as appropriate:  Tobacco   Allergies   Meds   Problems   Med Hx   Surg Hx   Fam Hx       Review of Systems: No other skin or systemic complaints except as noted in HPI or Assessment and Plan.   Objective  Well appearing patient in no apparent distress; mood and affect are within normal limits.  All skin waist up examined.  Mid Back 1.3 cm subcutaneous nodule  Left Shoulder - Anterior, Right Nasal Sidewall Tan macules.   Right Cheek Stuck-on, waxy, tan-brown papule or plaque --Discussed benign etiology and prognosis.   Upper Back Scaly erythematous papules   Assessment & Plan  Epidermal inclusion cyst Mid Back  Cyst with symptoms and/or recent change.  Discussed surgical excision to remove, including resulting scar and possible recurrence.  Patient will schedule for surgery. Pre-op information given.     Lentigo (2) Left Shoulder - Anterior; Right Nasal Sidewall  Vs early Sk's  Benign-appearing.  Observation.  Call clinic for new or changing lesions.  Recommend daily use of broad spectrum spf 30+ sunscreen to sun-exposed areas.    Seborrheic keratosis Right Cheek  Benign-appearing, observe.  Call for new or changing lesions.  Dermatitis Upper Back  Chronic condition with duration or expected duration over one year. Condition is bothersome to patient. Currently flared.  Start Triamcinolone 0.1% cream twice daily up to 2 weeks as needed. Avoid applying to face, groin, and axilla. Use as directed. Long-term use can cause thinning of the skin.   Topical steroids  (such as triamcinolone, fluocinolone, fluocinonide, mometasone, clobetasol, halobetasol, betamethasone, hydrocortisone) can cause thinning and lightening of the skin if they are used for too long in the same area. Your physician has selected the right strength medicine for your problem and area affected on the body. Please use your medication only as directed by your physician to prevent side effects.   Recommend OTC Gold Bond Rapid Relief Anti-Itch cream (pramoxine + menthol), CeraVe Anti-itch cream or lotion (pramoxine), Sarna lotion (Original- menthol + camphor or Sensitive- pramoxine) or Eucerin 12 hour Itch Relief lotion (menthol) up to 3 times per day to areas on body that are itchy.   Eczema is a chronic, relapsing, pruritic condition that can significantly affect quality of life. It is often associated with allergic rhinitis and/or asthma and can require treatment with topical medications, phototherapy, or in severe cases biologic injectable medication (Dupixent; Adbry) or Oral JAK inhibitors.   triamcinolone cream (KENALOG) 0.1 % - Upper Back Apply twice daily to affected areas up to 2 weeks as needed. Avoid face, groin, underarms   Return for Cyst excision next available.  I, Emelia Salisbury, CMA, am acting as scribe for Forest Gleason, MD.  Documentation: I have reviewed the above documentation for accuracy and completeness, and I agree with the above.  Forest Gleason, MD

## 2021-10-07 ENCOUNTER — Encounter: Payer: Self-pay | Admitting: Dermatology

## 2021-10-10 ENCOUNTER — Encounter: Payer: Medicare Other | Admitting: Dermatology

## 2021-10-17 ENCOUNTER — Ambulatory Visit (INDEPENDENT_AMBULATORY_CARE_PROVIDER_SITE_OTHER): Payer: Medicare Other | Admitting: Dermatology

## 2021-10-17 ENCOUNTER — Other Ambulatory Visit: Payer: Self-pay

## 2021-10-17 DIAGNOSIS — D485 Neoplasm of uncertain behavior of skin: Secondary | ICD-10-CM

## 2021-10-17 DIAGNOSIS — D489 Neoplasm of uncertain behavior, unspecified: Secondary | ICD-10-CM

## 2021-10-17 MED ORDER — MUPIROCIN 2 % EX OINT: 1.0000 "application " | TOPICAL_OINTMENT | Freq: Every day | CUTANEOUS | 0 refills | Status: AC

## 2021-10-17 NOTE — Patient Instructions (Addendum)

## 2021-10-17 NOTE — Progress Notes (Deleted)
° °  Follow-Up Visit   Subjective  Rachel Dorsey is a 66 y.o. female who presents for the following: Procedure (Patient here today for cyst removal at mid back. ).   The following portions of the chart were reviewed this encounter and updated as appropriate:       Review of Systems:  No other skin or systemic complaints except as noted in HPI or Assessment and Plan.  Objective  Well appearing patient in no apparent distress; mood and affect are within normal limits.  A focused examination was performed including back. Relevant physical exam findings are noted in the Assessment and Plan.  Mid Back 1.3 cm subcutaneous nodule    Assessment & Plan  Neoplasm of uncertain behavior Mid Back  Skin excision  Lesion length (cm):  1.3 Informed consent: discussed and consent obtained   Timeout: patient name, date of birth, surgical site, and procedure verified   Procedure prep:  Patient was prepped and draped in usual sterile fashion Prep type:  Chlorhexidine Anesthesia: the lesion was anesthetized in a standard fashion   Anesthetic:  1% lidocaine w/ epinephrine 1-100,000 buffered w/ 8.4% NaHCO3 (3 cc) Instrument used comment:  15 c Hemostasis achieved with: suture, pressure and electrodesiccation   Outcome: patient tolerated procedure well with no complications   Post-procedure details: wound care instructions given   Additional details:  Mupirocin and a pressure dressing applied  Skin repair Complexity:  Complex Informed consent: discussed and consent obtained   Timeout: patient name, date of birth, surgical site, and procedure verified   Procedure prep:  Patient was prepped and draped in usual sterile fashion Prep type:  Chlorhexidine Anesthesia: the lesion was anesthetized in a standard fashion   Anesthetic:  1% lidocaine w/ epinephrine 1-100,000 local infiltration Reason for type of repair: reduce tension to allow closure, reduce the risk of dehiscence, infection, and  necrosis, reduce subcutaneous dead space and avoid a hematoma, allow closure of the large defect, allow side-to-side closure without requiring a flap or graft and enhance both functionality and cosmetic results   Undermining: area extensively undermined   Subcutaneous layers (deep stitches):  Suture size:  4-0 Suture type: Vicryl (polyglactin 910)   Stitches:  Buried vertical mattress Fine/surface layer approximation (top stitches):  Suture size:  4-0 Suture type: Prolene (polypropylene)   Suture removal (days):  14 Hemostasis achieved with: pressure and electrodesiccation Outcome: patient tolerated procedure well with no complications   Post-procedure details: wound care instructions given   Additional details:  Extensive undermining greater than the maximum width of the defect along at least one entire edge of the defect was performed Maximum width of defect perpendicular to the line of the closure *** Width of undermining done ***  Mupirocin and a pressure bandage applied   Destruction of lesion  mupirocin ointment (BACTROBAN) 2 % Apply 1 application topically daily. With dressing changes  Specimen 1 - Surgical pathology Differential Diagnosis: r/o cyst vs other  Check Margins: No 1.3 cm subcutaneous nodule   Return in about 2 weeks (around 10/31/2021) for suture removal.  I, Ruthell Rummage, CMA, am acting as scribe for Forest Gleason, MD.

## 2021-10-17 NOTE — Progress Notes (Signed)
° °  Follow-Up Visit   Subjective  Rachel Dorsey is a 66 y.o. female who presents for the following: Procedure (Patient here today for cyst removal at mid back. ).  The following portions of the chart were reviewed this encounter and updated as appropriate:  Tobacco   Allergies   Meds   Problems   Med Hx   Surg Hx   Fam Hx       Review of Systems: No other skin or systemic complaints except as noted in HPI or Assessment and Plan.   Objective  Well appearing patient in no apparent distress; mood and affect are within normal limits.  A focused examination was performed including mid back. Relevant physical exam findings are noted in the Assessment and Plan.  Mid Back 1.3 cm subcutaneous nodule   Assessment & Plan  Neoplasm of uncertain behavior Mid Back  Skin excision  Lesion length (cm):  1.3 Margin per side (cm):  0.1 Total excision diameter (cm):  1.5 Informed consent: discussed and consent obtained   Timeout: patient name, date of birth, surgical site, and procedure verified   Procedure prep:  Patient was prepped and draped in usual sterile fashion Prep type:  Chlorhexidine Anesthesia: the lesion was anesthetized in a standard fashion   Anesthetic:  1% lidocaine w/ epinephrine 1-100,000 buffered w/ 8.4% NaHCO3 (3 cc) Instrument used comment:  15 c Hemostasis achieved with: suture, pressure and electrodesiccation   Outcome: patient tolerated procedure well with no complications   Post-procedure details: wound care instructions given   Additional details:  Mupirocin and a pressure dressing applied  Skin repair Complexity:  Intermediate Final length (cm):  3.7 Informed consent: discussed and consent obtained   Timeout: patient name, date of birth, surgical site, and procedure verified   Procedure prep:  Patient was prepped and draped in usual sterile fashion Prep type:  Chlorhexidine Anesthesia: the lesion was anesthetized in a standard fashion   Anesthetic:  1%  lidocaine w/ epinephrine 1-100,000 local infiltration Reason for type of repair: reduce tension to allow closure, reduce the risk of dehiscence, infection, and necrosis, reduce subcutaneous dead space and avoid a hematoma, allow closure of the large defect, preserve normal anatomy, preserve normal anatomical and functional relationships and enhance both functionality and cosmetic results   Undermining: edges undermined   Subcutaneous layers (deep stitches):  Suture size:  4-0 Suture type: Vicryl (polyglactin 910)   Stitches:  Buried vertical mattress Fine/surface layer approximation (top stitches):  Suture size:  4-0 Suture type: Prolene (polypropylene)   Suture removal (days):  14 Hemostasis achieved with: suture, pressure and electrodesiccation Outcome: patient tolerated procedure well with no complications   Post-procedure details: wound care instructions given   Additional details:  Mupirocin and a pressure bandage applied   mupirocin ointment (BACTROBAN) 2 % Apply 1 application topically daily. With dressing changes  Specimen 1 - Surgical pathology Differential Diagnosis: r/o cyst vs other  Check Margins: No 1.3 cm subcutaneous nodule   Return in about 2 weeks (around 10/31/2021) for suture removal.  I, Ruthell Rummage, CMA, am acting as scribe for Forest Gleason, MD.  Documentation: I have reviewed the above documentation for accuracy and completeness, and I agree with the above.  Forest Gleason, MD

## 2021-10-23 ENCOUNTER — Telehealth: Payer: Self-pay

## 2021-10-23 NOTE — Telephone Encounter (Signed)
Discussed pathology results with patient. [place] for S/R. 10/23/21 JP

## 2021-10-23 NOTE — Telephone Encounter (Signed)
-----   Message from Alfonso Patten, MD sent at 10/23/2021  1:18 PM EST ----- Skin (M), mid back LIPOMA Benign fatty growth, no additional treatment needed  MAs please call. Thank you!

## 2021-10-24 ENCOUNTER — Encounter: Payer: Self-pay | Admitting: Dermatology

## 2021-10-30 ENCOUNTER — Other Ambulatory Visit: Payer: Self-pay

## 2021-10-30 ENCOUNTER — Ambulatory Visit (INDEPENDENT_AMBULATORY_CARE_PROVIDER_SITE_OTHER): Payer: Medicare Other | Admitting: Dermatology

## 2021-10-30 DIAGNOSIS — T8130XA Disruption of wound, unspecified, initial encounter: Secondary | ICD-10-CM

## 2021-10-30 DIAGNOSIS — D171 Benign lipomatous neoplasm of skin and subcutaneous tissue of trunk: Secondary | ICD-10-CM

## 2021-10-30 DIAGNOSIS — T148XXA Other injury of unspecified body region, initial encounter: Secondary | ICD-10-CM

## 2021-10-30 MED ORDER — DOXYCYCLINE MONOHYDRATE 100 MG PO CAPS: 100.0000 mg | ORAL_CAPSULE | Freq: Two times a day (BID) | ORAL | 0 refills | Status: DC

## 2021-10-30 NOTE — Progress Notes (Signed)
° °  Follow-Up Visit   Subjective  Rachel Dorsey is a 66 y.o. female who presents for the following: Follow-up (Patient here today for suture removal at mid back showing benign lipoma. ).    The following portions of the chart were reviewed this encounter and updated as appropriate:   Tobacco   Allergies   Meds   Problems   Med Hx   Surg Hx   Fam Hx       Review of Systems:  No other skin or systemic complaints except as noted in HPI or Assessment and Plan.  Objective  Well appearing patient in no apparent distress; mood and affect are within normal limits.  A focused examination was performed including back. Relevant physical exam findings are noted in the Assessment and Plan.    Assessment & Plan  Open wound Mid Back  S/P excision with partial superficial dehiscence  Start doxycycline monohydrate 100 mg twice daily with food for 7 days.  Start mupirocin and cover with bandage daily (wound is very dry which is likely contributing to discomfort). Recommend non-adherent pad with brown paper tape. If she does not tolerate this, start blister bandaid to area, changing every 3 days.   Doxycycline should be taken with food to prevent nausea. Do not lay down for 30 minutes after taking. Be cautious with sun exposure and use good sun protection while on this medication. Pregnant women should not take this medication.   doxycycline (MONODOX) 100 MG capsule - Mid Back Take 1 capsule (100 mg total) by mouth 2 (two) times daily for 7 days.  Related Procedures Anaerobic and Aerobic Culture  Encounter for Removal of Sutures - Incision site at the mid back is clean, dry and intact - Wound cleansed, sutures removed, wound cleansed and steri strips applied.  - Discussed pathology results showing lipoma  - Patient advised to keep steri-strips dry until they fall off. - Scars remodel for a full year. - Once steri-strips fall off, patient can apply over-the-counter silicone scar cream each  night to help with scar remodeling if desired. - Patient advised to call with any concerns or if they notice any new or changing lesions.  Return in about 4 weeks (around 11/27/2021) for recheck excision site.  Graciella Belton, RMA, am acting as scribe for Forest Gleason, MD .  Documentation: I have reviewed the above documentation for accuracy and completeness, and I agree with the above.  Forest Gleason, MD

## 2021-10-30 NOTE — Patient Instructions (Addendum)
Continue wound care with mupirocin and cover for 5 days then can switch to blister band aid hydro seal.   Start doxycycline monohydrate 100 mg twice daily with food for 7 days.  Doxycycline should be taken with food to prevent nausea. Do not lay down for 30 minutes after taking. Be cautious with sun exposure and use good sun protection while on this medication. Pregnant women should not take this medication.   If You Need Anything After Your Visit  If you have any questions or concerns for your doctor, please call our main line at [phone] and press option 4 to reach your doctor's medical assistant. If no one answers, please leave a voicemail as directed and we will return your call as soon as possible. Messages left after 4 pm will be answered the following business day.   You may also send [place] a message via [place]. We typically respond to MyChart messages within 1-2 business days.  For prescription refills, please ask your pharmacy to contact our office. Our fax number is [phone].  If you have an urgent issue when the clinic is closed that cannot wait until the next business day, you can page your doctor at the number below.    Please note that while we do our best to be available for urgent issues outside of office hours, we are not available 24/7.   If you have an urgent issue and are unable to reach [place], you may choose to seek medical care at your doctor's office, retail clinic, urgent care center, or emergency room.  If you have a medical emergency, please immediately call 911 or go to the emergency department.  Pager Numbers  - Dr. Nehemiah Massed: [phone]  - Dr. Laurence Ferrari: [phone]  - Dr. Nicole Kindred: [phone]  In the event of inclement weather, please call our main line at [phone] for an update on the status of any delays or closures.  Dermatology Medication Tips: Please keep the boxes that topical medications come in in order to help keep track of the instructions  about where and how to use these. Pharmacies typically print the medication instructions only on the boxes and not directly on the medication tubes.   If your medication is too expensive, please contact our office at [phone] option 4 or send [place] a message through [place].   We are unable to tell what your co-pay for medications will be in advance as this is different depending on your insurance coverage. However, we may be able to find a substitute medication at lower cost or fill out paperwork to get insurance to cover a needed medication.   If a prior authorization is required to get your medication covered by your insurance company, please allow [place] 1-2 business days to complete this process.  Drug prices often vary depending on where the prescription is filled and some pharmacies may offer cheaper prices.  The website www.goodrx.com contains coupons for medications through different pharmacies. The prices here do not account for what the cost may be with help from insurance (it may be cheaper with your insurance), but the website can give you the price if you did not use any insurance.  - You can print the associated coupon and take it with your prescription to the pharmacy.  - You may also stop by our office during regular business hours and pick up a GoodRx coupon card.  - If you need your prescription sent electronically to a different pharmacy, notify our office through [facility]  or by phone at [phone] option 4.     Si Usted Necesita Algo Despus de Su Visita  Tambin puede enviarnos un mensaje a travs de Pharmacist, community. Por lo general respondemos a los mensajes de MyChart en el transcurso de 1 a 2 das hbiles.  Para renovar recetas, por favor pida a su farmacia que se ponga en contacto con nuestra oficina. Harland Dingwall de fax es [place] [phone].  Si tiene un asunto urgente cuando la clnica est cerrada y que no puede esperar hasta el siguiente da hbil, puede  llamar/localizar a su doctor(a) al nmero que aparece a continuacin.   Por favor, tenga en cuenta que aunque hacemos todo lo posible para estar disponibles para asuntos urgentes fuera del horario de [place], no estamos disponibles las 24 horas del da, los 7 das de la [place].   Si tiene un problema urgente y no puede comunicarse con nosotros, puede optar por buscar atencin mdica  en el consultorio de su doctor(a), en una clnica privada, en un centro de atencin urgente o en una sala de emergencias.  Si tiene Engineering geologist, por favor llame inmediatamente al 911 o vaya a la sala de emergencias.  Nmeros de bper  - Dr. Nehemiah Massed: [phone]  - Dra. Moye: [phone]  - Dra. Nicole Kindred: [phone]  En caso de inclemencias del [place], por favor llame a Johnsie Kindred principal al [phone] para una actualizacin sobre el [place] de cualquier retraso o cierre.  Consejos para la medicacin en dermatologa: Por favor, guarde las cajas en las que vienen los medicamentos de uso tpico para ayudarle a seguir las instrucciones sobre dnde y cmo usarlos. Las farmacias generalmente imprimen las instrucciones del medicamento slo en las cajas y no directamente en los tubos del [place].   Si su medicamento es muy caro, por favor, pngase en contacto con Zigmund Daniel llamando al [phone] y presione la opcin 4 o envenos un mensaje a travs de Pharmacist, community.   No podemos decirle cul ser su copago por los medicamentos por adelantado ya que esto es diferente dependiendo de la cobertura de su seguro. Sin embargo, es posible que podamos encontrar un medicamento sustituto a Electrical engineer un formulario para que el seguro cubra el medicamento que se considera necesario.   Si se requiere una autorizacin previa para que su compaa de seguros [place] su medicamento, por favor permtanos de 1 a 2 das hbiles para completar este proceso.  Los precios de los medicamentos varan con  frecuencia dependiendo del Environmental consultant de dnde se surte la receta y alguna farmacias pueden ofrecer precios ms baratos.  El sitio web www.goodrx.com tiene cupones para medicamentos de Airline pilot. Los precios aqu no tienen en cuenta lo que podra costar con la ayuda del seguro (puede ser ms barato con su seguro), pero el sitio web puede darle el precio si no utiliz Research scientist (physical sciences).  - Puede imprimir el cupn correspondiente y llevarlo con su receta a la farmacia.  - Tambin puede pasar por nuestra oficina durante el horario de atencin regular y Charity fundraiser una tarjeta de cupones de GoodRx.  - Si necesita que su receta se enve electrnicamente a una farmacia diferente, informe a nuestra oficina a travs de MyChart de [place] o por telfono llamando al [phone] y presione la opcin 4.

## 2021-11-05 ENCOUNTER — Encounter: Payer: Self-pay | Admitting: Dermatology

## 2021-11-05 ENCOUNTER — Telehealth: Payer: Self-pay

## 2021-11-05 ENCOUNTER — Telehealth: Payer: Self-pay | Admitting: Dermatology

## 2021-11-05 DIAGNOSIS — T148XXA Other injury of unspecified body region, initial encounter: Secondary | ICD-10-CM

## 2021-11-05 NOTE — Telephone Encounter (Signed)
Patient states the area does not look as good as she would like it to. She states today is the last day of her Doxycycline. She is concerned area is still infected. She wanted to know if she should get another Rx for Doxycycline. Please advise. JP

## 2021-11-05 NOTE — Telephone Encounter (Signed)
Please call to check on Rachel Dorsey's wound to ensure she is feeling better. Please let me know if she is not improving. Thank you!

## 2021-11-06 MED ORDER — DOXYCYCLINE MONOHYDRATE 100 MG PO CAPS: 100.0000 mg | ORAL_CAPSULE | Freq: Two times a day (BID) | ORAL | 0 refills | Status: DC

## 2021-11-06 NOTE — Telephone Encounter (Signed)
Ok to send 1 refill on doxycycline and continue 1 more week. Please also be sure she is keeping it moist with the mupirocin ointment and a bandage 24/7. Thank you!

## 2021-11-06 NOTE — Telephone Encounter (Signed)
Ok to send 1 refill on doxycycline and continue 1 more week. Please also be sure she is keeping it moist with the mupirocin ointment and a bandage 24/7. Doxycycline Rf sent to pharmacy. C/B if not continuing to improve. JP

## 2021-11-08 ENCOUNTER — Other Ambulatory Visit: Payer: Self-pay

## 2021-11-08 ENCOUNTER — Ambulatory Visit (INDEPENDENT_AMBULATORY_CARE_PROVIDER_SITE_OTHER): Payer: Medicare Other | Admitting: Dermatology

## 2021-11-08 ENCOUNTER — Telehealth: Payer: Self-pay

## 2021-11-08 DIAGNOSIS — S31000A Unspecified open wound of lower back and pelvis without penetration into retroperitoneum, initial encounter: Secondary | ICD-10-CM

## 2021-11-08 DIAGNOSIS — L905 Scar conditions and fibrosis of skin: Secondary | ICD-10-CM

## 2021-11-08 DIAGNOSIS — T148XXA Other injury of unspecified body region, initial encounter: Secondary | ICD-10-CM

## 2021-11-08 LAB — ANAEROBIC AND AEROBIC CULTURE

## 2021-11-08 NOTE — Patient Instructions (Signed)

## 2021-11-08 NOTE — Progress Notes (Signed)
° °  Follow-Up Visit   Subjective  Rachel Dorsey is a 66 y.o. female who presents for the following: Follow-up (1 week f/u post cyst excision on the mid back, pt taking 10 day dose of Doxycycline and using Mupirocin ointment daily, pt concerned this area could be infected.).  The following portions of the chart were reviewed this encounter and updated as appropriate:   Tobacco   Allergies   Meds   Problems   Med Hx   Surg Hx   Fam Hx       Review of Systems:  No other skin or systemic complaints except as noted in HPI or Assessment and Plan.  Objective  Well appearing patient in no apparent distress; mood and affect are within normal limits.  A focused examination was performed including back. Relevant physical exam findings are noted in the Assessment and Plan.  Mid Back 2.3 x 1.5 cm wound healing, no evidence of infection     Assessment & Plan  Open wound Mid Back  S/p excision of lipoma with significant scarring With wound dehiscence, no signs of infection today.  Start sample of StrataGRT gel apply to affected skin once a day and let dry, then use non-adherent pad to protect site from rubbing on bra When she runs out of StrataGRT sample, recommend applying blister bandaid, can leave on for up to 3 days  D/C Doxycycline tablets      Return for recheck of excision site as scheduled March 16.  I, Marye Round, CMA, am acting as scribe for Forest Gleason, MD .   Documentation: I have reviewed the above documentation for accuracy and completeness, and I agree with the above.  Forest Gleason, MD

## 2021-11-08 NOTE — Telephone Encounter (Signed)
Patient advises culture did not grow bacteria, no add'l treatment needed with antibiotic at this time.  Lurlean Horns., RMA

## 2021-11-08 NOTE — Telephone Encounter (Signed)
-----   Message from Alfonso Patten, MD sent at 11/08/2021  3:40 PM EST ----- No bacteria grew from culture and exam today did not look infected. No need for additional antibiotic treatment at this time.  MAs please call. Thank you!

## 2021-11-12 ENCOUNTER — Encounter: Payer: Self-pay | Admitting: Dermatology

## 2021-11-28 ENCOUNTER — Ambulatory Visit: Payer: Medicare Other | Admitting: Dermatology

## 2021-11-29 ENCOUNTER — Ambulatory Visit: Payer: Medicare Other | Admitting: Dermatology

## 2021-12-11 ENCOUNTER — Ambulatory Visit: Payer: Medicare Other | Admitting: Dermatology

## 2021-12-31 ENCOUNTER — Ambulatory Visit
Admission: RE | Admit: 2021-12-31 | Discharge: 2021-12-31 | Disposition: A | Discharge status: Home / Self Care | Payer: Medicare Other | Source: Ambulatory Visit | Attending: Family Medicine | Admitting: Family Medicine

## 2021-12-31 ENCOUNTER — Other Ambulatory Visit: Payer: Self-pay | Admitting: Family Medicine

## 2021-12-31 DIAGNOSIS — M79662 Pain in left lower leg: Secondary | ICD-10-CM | POA: Diagnosis present

## 2022-11-13 ENCOUNTER — Encounter: Payer: Self-pay | Admitting: Podiatry

## 2022-11-13 ENCOUNTER — Ambulatory Visit (INDEPENDENT_AMBULATORY_CARE_PROVIDER_SITE_OTHER): Payer: Medicare Other | Admitting: Podiatry

## 2022-11-13 DIAGNOSIS — L600 Ingrowing nail: Secondary | ICD-10-CM

## 2022-11-13 NOTE — Progress Notes (Signed)
Subjective:  Patient ID: Rachel Dorsey, female    DOB: 05-Nov-1955,  MRN: [id number]  Chief Complaint  Patient presents with   Nail Problem    Pt stated that she is having discomfort with her hallux nails     67 y.o. female presents with the above complaint.  Patient presents with bilateral hallux medial border ingrown painful to touch is progressive gotten worse worse with ambulation worse with pressure she would like to have it removed she has not seen MRIs prior to seeing me denies any other acute complaints   Review of Systems: Negative except as noted in the HPI. Denies N/V/F/Ch.  Past Medical History:  Diagnosis Date   Endometriosis    Family history of breast cancer    Postmenopausal atrophic vaginitis     Current Outpatient Medications:    Biotin 1000 MCG tablet, Take by mouth., Disp: , Rfl:    Cholecalciferol (VITAMIN D) 2000 units tablet, Take by mouth., Disp: , Rfl:    hydrocortisone 2.5 % cream, Apply to lip twice a day for up to one week as needed., Disp: 28 g, Rfl: 2   mupirocin ointment (BACTROBAN) 2 %, Apply 1 application topically daily. With dressing changes, Disp: 22 g, Rfl: 0   progesterone (PROMETRIUM) 200 MG capsule, , Disp: , Rfl:    triamcinolone cream (KENALOG) 0.1 %, Apply twice daily to affected areas up to 2 weeks as needed. Avoid face, groin, underarms, Disp: 453 g, Rfl: 2   valACYclovir (VALTREX) 1000 MG tablet, Take 1 tablet by mouth daily., Disp: , Rfl:    vitamin B-12 (CYANOCOBALAMIN) 100 MCG tablet, Take 100 mcg by mouth daily., Disp: , Rfl:   Current Facility-Administered Medications:    betamethasone acetate-betamethasone sodium phosphate (CELESTONE) injection 12 mg, 12 mg, Intramuscular, Once, Edrick Kins, DPM  Social History   Tobacco Use  Smoking Status Never  Smokeless Tobacco Never    Allergies  Allergen Reactions   Erythromycin Diarrhea    Colitis   Objective:  There were no vitals filed for this visit. There is no height  or weight on file to calculate BMI. Constitutional Well developed. Well nourished.  Vascular Dorsalis pedis pulses palpable bilaterally. Posterior tibial pulses palpable bilaterally. Capillary refill normal to all digits.  No cyanosis or clubbing noted. Pedal hair growth normal.  Neurologic Normal speech. Oriented to person, place, and time. Epicritic sensation to light touch grossly present bilaterally.  Dermatologic Painful ingrowing nail at medial nail borders of the hallux nail bilaterally. No other open wounds. No skin lesions.  Orthopedic: Normal joint ROM without pain or crepitus bilaterally. No visible deformities. No bony tenderness.   Radiographs: None Assessment:   1. Ingrown toenail of right foot   2. Ingrown left big toenail    Plan:  Patient was evaluated and treated and all questions answered.  Ingrown Nail, bilaterally -Patient elects to proceed with minor surgery to remove ingrown toenail removal today. Consent reviewed and signed by patient. -Ingrown nail excised. See procedure note. -Educated on post-procedure care including soaking. Written instructions provided and reviewed. -Patient to follow up in 2 weeks for nail check.  Procedure: Excision of Ingrown Toenail Location: Bilateral 1st toe medial nail borders. Anesthesia: Lidocaine 1% plain; 1.5 mL and Marcaine 0.5% plain; 1.5 mL, digital block. Skin Prep: Betadine. Dressing: Silvadene; telfa; dry, sterile, compression dressing. Technique: Following skin prep, the toe was exsanguinated and a tourniquet was secured at the base of the toe. The affected nail border was freed,  split with a nail splitter, and excised. Chemical matrixectomy was then performed with phenol and irrigated out with alcohol. The tourniquet was then removed and sterile dressing applied. Disposition: Patient tolerated procedure well. Patient to return in 2 weeks for follow-up.   No follow-ups on file.

## 2022-11-26 ENCOUNTER — Other Ambulatory Visit: Payer: Self-pay | Admitting: Internal Medicine

## 2022-11-26 DIAGNOSIS — R1011 Right upper quadrant pain: Secondary | ICD-10-CM

## 2022-12-02 ENCOUNTER — Ambulatory Visit
Admission: RE | Admit: 2022-12-02 | Discharge: 2022-12-02 | Disposition: A | Discharge status: Home / Self Care | Payer: Medicare Other | Source: Ambulatory Visit | Attending: Internal Medicine | Admitting: Internal Medicine

## 2022-12-02 DIAGNOSIS — R1011 Right upper quadrant pain: Secondary | ICD-10-CM | POA: Insufficient documentation

## 2022-12-02 MED ORDER — IOHEXOL 300 MG/ML  SOLN
100.0000 mL | Freq: Once | INTRAMUSCULAR | Status: AC | PRN
  Administered 2022-12-02: 100 mL via INTRAVENOUS

## 2022-12-31 ENCOUNTER — Ambulatory Visit (INDEPENDENT_AMBULATORY_CARE_PROVIDER_SITE_OTHER): Payer: Medicare Other | Admitting: Podiatry

## 2022-12-31 ENCOUNTER — Encounter: Payer: Self-pay | Admitting: Podiatry

## 2022-12-31 VITALS — BP 97/65 | HR 86

## 2022-12-31 DIAGNOSIS — M79675 Pain in left toe(s): Secondary | ICD-10-CM

## 2022-12-31 DIAGNOSIS — B351 Tinea unguium: Secondary | ICD-10-CM | POA: Diagnosis not present

## 2022-12-31 DIAGNOSIS — M79674 Pain in right toe(s): Secondary | ICD-10-CM

## 2022-12-31 NOTE — Progress Notes (Signed)
   Chief Complaint  Patient presents with   Nail Problem    "Dr. Allena Katz removed an ingrown toenail on my right big toe.  It still hurts."    HPI: 67 y.o. female presenting today for evaluation of pain and tenderness associated to the toenails bilateral.  She does have history of nail matricectomy.  She continues to have some pain and tenderness to the medial border of the right great toe.  Presenting for further treatment and evaluation  Past Medical History:  Diagnosis Date   Endometriosis    Family history of breast cancer    Postmenopausal atrophic vaginitis     Past Surgical History:  Procedure Laterality Date   BREAST BIOPSY Right 02-20-00   cyst   COLONOSCOPY  2010   Dr Mechele Collin   FOOT SURGERY Right    LAPAROSCOPIC HYSTERECTOMY  1985   age 43   OOPHORECTOMY  35   PLACEMENT OF BREAST IMPLANTS  2006   [place], [place]. [place] saline implants.     Allergies  Allergen Reactions   Erythromycin Diarrhea    Colitis     Physical Exam: General: The patient is alert and oriented x3 in no acute distress.  Dermatology: Skin is warm, dry and supple bilateral lower extremities.  Hyperkeratotic dystrophic nails noted 1-5 bilateral  Vascular: Palpable pedal pulses bilaterally. Capillary refill within normal limits.  No appreciable edema.  No erythema.  Neurological: Grossly intact via light touch  Musculoskeletal Exam: No pedal deformities noted  Assessment/Plan of Care: 1.  Pain due to onychomycosis of toenails both  -Mechanical debridement of the nails 1-5 bilateral was performed using a nail nipper without incident or bleeding.  After debridement especially of the medial border of the right hallux nail plate there was significant relief of the tenderness and pain that she was experiencing -Recommend wide fitting shoes that do not irritate or constrict the toebox area -Return to clinic RN       Felecia Shelling, DPM [facility]  Dr. Felecia Shelling,  DPM    [address] [place], [place] [address]                Office [phone]  Fax [phone]

## 2023-02-25 ENCOUNTER — Other Ambulatory Visit: Payer: Self-pay

## 2023-02-25 DIAGNOSIS — E782 Mixed hyperlipidemia: Secondary | ICD-10-CM

## 2023-03-05 ENCOUNTER — Ambulatory Visit
Admission: RE | Admit: 2023-03-05 | Discharge: 2023-03-05 | Disposition: A | Discharge status: Home / Self Care | Payer: Medicare Other | Source: Ambulatory Visit | Attending: Internal Medicine | Admitting: Internal Medicine

## 2023-03-05 DIAGNOSIS — E782 Mixed hyperlipidemia: Secondary | ICD-10-CM | POA: Insufficient documentation

## 2023-08-07 ENCOUNTER — Other Ambulatory Visit: Payer: Self-pay | Admitting: Internal Medicine

## 2023-08-07 DIAGNOSIS — R918 Other nonspecific abnormal finding of lung field: Secondary | ICD-10-CM

## 2023-09-08 ENCOUNTER — Ambulatory Visit
Admission: RE | Admit: 2023-09-08 | Discharge: 2023-09-08 | Disposition: A | Discharge status: Home / Self Care | Payer: Medicare Other | Source: Ambulatory Visit | Attending: Internal Medicine | Admitting: Internal Medicine

## 2023-09-08 DIAGNOSIS — R918 Other nonspecific abnormal finding of lung field: Secondary | ICD-10-CM | POA: Insufficient documentation

## 2023-09-08 MED ORDER — IOHEXOL 300 MG/ML  SOLN
75.0000 mL | Freq: Once | INTRAMUSCULAR | Status: AC | PRN
  Administered 2023-09-08: 75 mL via INTRAVENOUS

## 2024-07-19 ENCOUNTER — Encounter: Admitting: Certified Nurse Midwife

## 2024-08-16 ENCOUNTER — Other Ambulatory Visit: Payer: Self-pay | Admitting: Internal Medicine

## 2024-08-16 DIAGNOSIS — R918 Other nonspecific abnormal finding of lung field: Secondary | ICD-10-CM

## 2024-08-16 DIAGNOSIS — G250 Essential tremor: Secondary | ICD-10-CM

## 2024-08-18 ENCOUNTER — Ambulatory Visit
Admission: RE | Admit: 2024-08-18 | Discharge: 2024-08-18 | Disposition: A | Source: Ambulatory Visit | Attending: Internal Medicine | Admitting: Internal Medicine

## 2024-08-18 DIAGNOSIS — R918 Other nonspecific abnormal finding of lung field: Secondary | ICD-10-CM | POA: Insufficient documentation

## 2024-08-18 DIAGNOSIS — G250 Essential tremor: Secondary | ICD-10-CM | POA: Diagnosis present

## 2024-08-31 ENCOUNTER — Ambulatory Visit
Admission: RE | Admit: 2024-08-31 | Discharge: 2024-08-31 | Disposition: A | Source: Ambulatory Visit | Attending: Internal Medicine | Admitting: Internal Medicine

## 2024-08-31 ENCOUNTER — Other Ambulatory Visit: Payer: Self-pay | Admitting: Internal Medicine

## 2024-08-31 ENCOUNTER — Encounter: Payer: Self-pay | Admitting: Internal Medicine

## 2024-08-31 DIAGNOSIS — H7092 Unspecified mastoiditis, left ear: Secondary | ICD-10-CM

## 2024-08-31 MED ORDER — IOHEXOL 300 MG/ML  SOLN
75.0000 mL | Freq: Once | INTRAMUSCULAR | Status: AC | PRN
  Administered 2024-08-31: 17:00:00 75 mL via INTRAVENOUS

## 2024-08-31 NOTE — Progress Notes (Signed)
 Patient Profile:   Rachel Dorsey  is a 68 y.o.  female Chief Complaint  Patient presents with   Jaw Pain    Patient complains of left jaw pain x 3 weeks. Patient is unable to eat and sleep because of pain.    Shoulder Pain    Patient complains of left sided shoulder pain x 3-4 days.       PROBLEM LIST: Past Medical History:  Diagnosis Date   C. difficile colitis    Fibrocystic breast disease    Hyperlipidemia, mixed 02/03/2020    Past Surgical History:  Procedure Laterality Date   HYSTERECTOMY  1985   ASPIRATION CYST BREAST  1990   demonstrated fibrocystic breast disease   OOPHORECTOMY Right 1995   COLONOSCOPY  08/18/2001   COLONOSCOPY  12/06/2008   Internal hemorrhoids: [facility] 11/2018: Recall ltr mailed    COLONOSCOPY  12/28/2019   Diverticulosis/Otherwise normal/Repeat 36yrs/TKT   APPENDECTOMY      ALLERGIES: Allergies  Allergen Reactions   Doxycycline  Diarrhea   Erythromycin Diarrhea    Colitis    CURRENT MEDICATIONS: Current Outpatient Medications  Medication Sig Dispense Refill   b complex vitamins capsule Take 1 capsule by mouth once daily     cholecalciferol (VITAMIN D3) 2,000 unit tablet Take 2,000 Units by mouth once daily.     estradiol (VIVELLE-DOT) patch 0.05 mg/24 hr APPLY 1 PATCH BY TRANSDERMAL ROUTE TWICE WEEKLY     Lactobacillus acidophilus (PROBIOTIC ORAL) Take by mouth One daily     multivitamin tablet Take 1 tablet by mouth once daily     predniSONE (DELTASONE) 20 MG tablet Take 3 tablets (60 mg total) by mouth once daily for 2 days, THEN 2 tablets (40 mg total) once daily for 2 days, THEN 1 tablet (20 mg total) once daily for 2 days. 12 tablet 0   propranoloL (INDERAL) 20 MG tablet Take 1 tablet (20 mg total) by mouth once daily     TESTOSTERONE IM Inject into the muscle once a week     valACYclovir (VALTREX) 1000 MG tablet 2 pills twice a day for 1 day, cold sore beginning 30 tablet 1    ALPRAZolam (XANAX) 0.25 MG tablet Take 1 tablet (0.25 mg total) by mouth 2 (two) times daily as needed for Sleep for up to 30 days 30 tablet 0   diclofenac (VOLTAREN) 75 MG EC tablet Take 1 tablet (75 mg total) by mouth 2 (two) times daily with meals 60 tablet 11   No current facility-administered medications for this visit.      HPI   CLINICAL SUMMARY:  Patient presents with 2 weeks of progressive left temporal bone pain.  Quite severe.  No clear cervical radiculitis.  High-dose prednisone helped relieve the pain and then it came back with low-dose prednisone.  Severe pain upon biting but no crepitance of her TMJ.  Has deep ear pain.  No fever, no chills.  Dentist said he was not her teeth.  ROS: Review of systems is unremarkable for any active cardiac, respiratory, GI, GU, hematologic, neurologic, dermatologic, HEENT, or psychiatric symptoms except as noted above, 10 systems reviewed.  No fevers, chills, or constitutional symptoms.   PHYSICAL EXAM  Vital signs:  BP (!) 130/90   Pulse 88   SpO2 98%  There is no height or weight on file to calculate  BMI.   Wt Readings from Last 3 Encounters:  08/25/24 72.1 kg (159 lb)  06/17/24 75.8 kg (167 lb)  01/15/24 73.5 kg (162 lb)     BP Readings from Last 3 Encounters:  08/31/24 (!) 130/90  08/25/24 122/80  06/17/24 122/80    Constitutional: Acute distress Neck: Significant/severe tenderness along the left temporal bone, no clear dislocation or crepitance of the left TMJ, no pain with TMJ motion. Ear-no infection seen Respiratory:clear to auscultation, no rales or wheezes Cardiovascular:RRR, no murmur or gallop Abdominal:soft, good BS, NT Ext: no edema, good peripheral pulses Neuro: alert and oriented X 3, grossly nonfocal     ASSESSMENT/PLAN   Left upper bone pain-agree that this is not dental, no sign for abscess on exam.  Doubt TMJ, seems deeper.  Check CRP, CT temporal bone to evaluate for mastoiditis or other bony  abnormalities. Off prednisone, diclofenac 75 mg twice daily, Xanax 0.25 mg 3 times daily as needed anxiety, muscle tension Doubt cervical nerve related, severe pain, progression, patient in significant distress  Dispo:   No follow-ups on file.

## 2024-09-10 ENCOUNTER — Encounter: Payer: Self-pay | Admitting: Otolaryngology

## 2024-09-13 ENCOUNTER — Other Ambulatory Visit: Payer: Self-pay | Admitting: Otolaryngology

## 2024-09-13 DIAGNOSIS — M26602 Left temporomandibular joint disorder, unspecified: Secondary | ICD-10-CM
